# Patient Record
Sex: Female | Born: 1985 | Race: Black or African American | Hispanic: No | Smoking: Current every day smoker
Health system: Southern US, Community
[De-identification: ages and names within clinical notes are randomized; demographics above are authoritative.]

## PROBLEM LIST (undated history)

## (undated) ENCOUNTER — Inpatient Hospital Stay: Payer: Self-pay

## (undated) DIAGNOSIS — D649 Anemia, unspecified: Secondary | ICD-10-CM

## (undated) DIAGNOSIS — R87629 Unspecified abnormal cytological findings in specimens from vagina: Secondary | ICD-10-CM

## (undated) DIAGNOSIS — Z8759 Personal history of other complications of pregnancy, childbirth and the puerperium: Secondary | ICD-10-CM

## (undated) DIAGNOSIS — Z789 Other specified health status: Secondary | ICD-10-CM

## (undated) HISTORY — PX: WISDOM TOOTH EXTRACTION: SHX21

## (undated) HISTORY — DX: Unspecified abnormal cytological findings in specimens from vagina: R87.629

---
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
## 2007-05-20 ENCOUNTER — Encounter: Payer: Self-pay | Admitting: Maternal and Fetal Medicine

## 2007-05-20 ENCOUNTER — Inpatient Hospital Stay: Payer: Self-pay

## 2007-10-27 ENCOUNTER — Emergency Department: Payer: Self-pay | Admitting: Emergency Medicine

## 2008-04-16 ENCOUNTER — Emergency Department: Payer: Self-pay | Admitting: Emergency Medicine

## 2008-10-29 ENCOUNTER — Emergency Department: Payer: Self-pay | Admitting: Emergency Medicine

## 2008-12-28 ENCOUNTER — Emergency Department: Payer: Self-pay | Admitting: Emergency Medicine

## 2009-08-01 ENCOUNTER — Emergency Department: Payer: Self-pay | Admitting: Emergency Medicine

## 2010-04-06 ENCOUNTER — Emergency Department: Payer: Self-pay | Admitting: Emergency Medicine

## 2010-08-08 ENCOUNTER — Emergency Department: Payer: Self-pay | Admitting: Emergency Medicine

## 2011-02-06 ENCOUNTER — Emergency Department: Payer: Self-pay | Admitting: Emergency Medicine

## 2011-04-09 ENCOUNTER — Inpatient Hospital Stay: Payer: Self-pay

## 2011-04-13 LAB — PATHOLOGY REPORT

## 2011-08-08 ENCOUNTER — Emergency Department: Payer: Self-pay | Admitting: *Deleted

## 2013-02-02 ENCOUNTER — Emergency Department: Payer: Self-pay | Admitting: Internal Medicine

## 2013-03-24 ENCOUNTER — Emergency Department: Payer: Self-pay | Admitting: Emergency Medicine

## 2013-03-24 LAB — URINALYSIS, COMPLETE
Bilirubin,UR: NEGATIVE
Ph: 6 (ref 4.5–8.0)
RBC,UR: 13427 /HPF (ref 0–5)
WBC UR: 1263 /HPF (ref 0–5)

## 2013-03-25 LAB — URINE CULTURE

## 2013-04-14 ENCOUNTER — Emergency Department: Payer: Self-pay | Admitting: Emergency Medicine

## 2013-12-25 ENCOUNTER — Emergency Department: Payer: Self-pay | Admitting: Emergency Medicine

## 2013-12-25 LAB — URINALYSIS, COMPLETE
BACTERIA: NONE SEEN
BILIRUBIN, UR: NEGATIVE
Blood: NEGATIVE
Glucose,UR: NEGATIVE mg/dL (ref 0–75)
Ketone: NEGATIVE
Leukocyte Esterase: NEGATIVE
Nitrite: NEGATIVE
Ph: 5 (ref 4.5–8.0)
Protein: NEGATIVE
RBC,UR: 1 /HPF (ref 0–5)
Specific Gravity: 1.02 (ref 1.003–1.030)
Squamous Epithelial: 4

## 2013-12-25 LAB — GC/CHLAMYDIA PROBE AMP

## 2013-12-25 LAB — PREGNANCY, URINE: Pregnancy Test, Urine: NEGATIVE m[IU]/mL

## 2013-12-25 LAB — WET PREP, GENITAL

## 2014-03-02 ENCOUNTER — Emergency Department: Payer: Self-pay | Admitting: Emergency Medicine

## 2014-03-02 LAB — CBC WITH DIFFERENTIAL/PLATELET
BASOS ABS: 0.1 10*3/uL (ref 0.0–0.1)
BASOS PCT: 0.7 %
EOS ABS: 0 10*3/uL (ref 0.0–0.7)
Eosinophil %: 0.2 %
HCT: 43 % (ref 35.0–47.0)
HGB: 14.3 g/dL (ref 12.0–16.0)
Lymphocyte #: 1.5 10*3/uL (ref 1.0–3.6)
Lymphocyte %: 18.4 %
MCH: 29.9 pg (ref 26.0–34.0)
MCHC: 33.3 g/dL (ref 32.0–36.0)
MCV: 90 fL (ref 80–100)
Monocyte #: 0.5 x10 3/mm (ref 0.2–0.9)
Monocyte %: 6.2 %
Neutrophil #: 6.2 10*3/uL (ref 1.4–6.5)
Neutrophil %: 74.5 %
Platelet: 205 10*3/uL (ref 150–440)
RBC: 4.79 10*6/uL (ref 3.80–5.20)
RDW: 12.9 % (ref 11.5–14.5)
WBC: 8.3 10*3/uL (ref 3.6–11.0)

## 2014-03-02 LAB — BASIC METABOLIC PANEL
Anion Gap: 5 — ABNORMAL LOW (ref 7–16)
BUN: 11 mg/dL (ref 7–18)
CO2: 27 mmol/L (ref 21–32)
CREATININE: 0.95 mg/dL (ref 0.60–1.30)
Calcium, Total: 9.2 mg/dL (ref 8.5–10.1)
Chloride: 103 mmol/L (ref 98–107)
EGFR (African American): >60
EGFR (Non-African Amer.): >60
Glucose: 71 mg/dL (ref 65–99)
OSMOLALITY: 268 (ref 275–301)
Potassium: 3.6 mmol/L (ref 3.5–5.1)
SODIUM: 135 mmol/L — AB (ref 136–145)

## 2014-07-25 ENCOUNTER — Emergency Department: Payer: Self-pay | Admitting: Student

## 2014-07-25 LAB — CBC
HCT: 40 % (ref 35.0–47.0)
HGB: 13 g/dL (ref 12.0–16.0)
MCH: 30.3 pg (ref 26.0–34.0)
MCHC: 32.5 g/dL (ref 32.0–36.0)
MCV: 93 fL (ref 80–100)
PLATELETS: 173 10*3/uL (ref 150–440)
RBC: 4.3 10*6/uL (ref 3.80–5.20)
RDW: 13.1 % (ref 11.5–14.5)
WBC: 5.6 10*3/uL (ref 3.6–11.0)

## 2014-07-25 LAB — URINALYSIS, COMPLETE
Bacteria: NONE SEEN
Bilirubin,UR: NEGATIVE
Glucose,UR: NEGATIVE mg/dL (ref 0–75)
Ketone: NEGATIVE
Leukocyte Esterase: NEGATIVE
Nitrite: NEGATIVE
PH: 6 (ref 4.5–8.0)
Protein: NEGATIVE
RBC,UR: 43 /HPF (ref 0–5)
SPECIFIC GRAVITY: 1.026 (ref 1.003–1.030)
Squamous Epithelial: 9
WBC UR: 2 /HPF (ref 0–5)

## 2014-07-25 LAB — COMPREHENSIVE METABOLIC PANEL
ALBUMIN: 3.7 g/dL (ref 3.4–5.0)
ALT: 17 U/L
Alkaline Phosphatase: 46 U/L
Anion Gap: 8 (ref 7–16)
BILIRUBIN TOTAL: 0.3 mg/dL (ref 0.2–1.0)
BUN: 12 mg/dL (ref 7–18)
CHLORIDE: 106 mmol/L (ref 98–107)
Calcium, Total: 8.6 mg/dL (ref 8.5–10.1)
Co2: 26 mmol/L (ref 21–32)
Creatinine: 0.97 mg/dL (ref 0.60–1.30)
EGFR (African American): >60
EGFR (Non-African Amer.): >60
GLUCOSE: 72 mg/dL (ref 65–99)
Osmolality: 278 (ref 275–301)
POTASSIUM: 4.1 mmol/L (ref 3.5–5.1)
SGOT(AST): 25 U/L (ref 15–37)
Sodium: 140 mmol/L (ref 136–145)
TOTAL PROTEIN: 7.7 g/dL (ref 6.4–8.2)

## 2014-07-25 LAB — LIPASE, BLOOD: Lipase: 117 U/L (ref 73–393)

## 2014-07-25 LAB — GC/CHLAMYDIA PROBE AMP

## 2014-07-25 LAB — WET PREP, GENITAL

## 2014-08-09 ENCOUNTER — Emergency Department: Payer: Self-pay | Admitting: Emergency Medicine

## 2014-08-10 LAB — URINALYSIS, COMPLETE
BLOOD: NEGATIVE
Bilirubin,UR: NEGATIVE
Glucose,UR: NEGATIVE mg/dL (ref 0–75)
Ketone: NEGATIVE
Leukocyte Esterase: NEGATIVE
NITRITE: NEGATIVE
PROTEIN: NEGATIVE
Ph: 7 (ref 4.5–8.0)
RBC,UR: 5 /HPF (ref 0–5)
Specific Gravity: 1.01 (ref 1.003–1.030)
Squamous Epithelial: 2
WBC UR: 1 /HPF (ref 0–5)

## 2014-09-29 ENCOUNTER — Emergency Department: Payer: Self-pay | Admitting: Emergency Medicine

## 2014-09-29 LAB — COMPREHENSIVE METABOLIC PANEL
Albumin: 3.7 g/dL (ref 3.4–5.0)
Alkaline Phosphatase: 59 U/L
Anion Gap: 7 (ref 7–16)
BUN: 11 mg/dL (ref 7–18)
Bilirubin,Total: 0.5 mg/dL (ref 0.2–1.0)
CALCIUM: 8.6 mg/dL (ref 8.5–10.1)
Chloride: 106 mmol/L (ref 98–107)
Co2: 27 mmol/L (ref 21–32)
Creatinine: 0.75 mg/dL (ref 0.60–1.30)
EGFR (Non-African Amer.): >60
Glucose: 81 mg/dL (ref 65–99)
Osmolality: 278 (ref 275–301)
Potassium: 3.9 mmol/L (ref 3.5–5.1)
SGOT(AST): 22 U/L (ref 15–37)
SGPT (ALT): 19 U/L
SODIUM: 140 mmol/L (ref 136–145)
Total Protein: 7.7 g/dL (ref 6.4–8.2)

## 2014-09-29 LAB — CBC WITH DIFFERENTIAL/PLATELET
Basophil #: 0 10*3/uL (ref 0.0–0.1)
Basophil %: 0.5 %
Eosinophil #: 0 10*3/uL (ref 0.0–0.7)
Eosinophil %: 0.6 %
HCT: 40.2 % (ref 35.0–47.0)
HGB: 13.2 g/dL (ref 12.0–16.0)
LYMPHS ABS: 1.8 10*3/uL (ref 1.0–3.6)
Lymphocyte %: 34.8 %
MCH: 29.8 pg (ref 26.0–34.0)
MCHC: 32.8 g/dL (ref 32.0–36.0)
MCV: 91 fL (ref 80–100)
Monocyte #: 0.3 x10 3/mm (ref 0.2–0.9)
Monocyte %: 6.6 %
NEUTROS ABS: 3 10*3/uL (ref 1.4–6.5)
Neutrophil %: 57.5 %
PLATELETS: 183 10*3/uL (ref 150–440)
RBC: 4.42 10*6/uL (ref 3.80–5.20)
RDW: 12.6 % (ref 11.5–14.5)
WBC: 5.2 10*3/uL (ref 3.6–11.0)

## 2014-09-29 LAB — LIPASE, BLOOD: LIPASE: 100 U/L (ref 73–393)

## 2014-09-29 LAB — URINALYSIS, COMPLETE
BILIRUBIN, UR: NEGATIVE
Blood: NEGATIVE
GLUCOSE, UR: NEGATIVE mg/dL (ref 0–75)
Ketone: NEGATIVE
Leukocyte Esterase: NEGATIVE
Nitrite: NEGATIVE
PH: 6 (ref 4.5–8.0)
PROTEIN: NEGATIVE
RBC,UR: <1 /HPF (ref 0–5)
Specific Gravity: 1.013 (ref 1.003–1.030)
WBC UR: 2 /HPF (ref 0–5)

## 2014-09-30 LAB — WET PREP, GENITAL

## 2014-09-30 LAB — GC/CHLAMYDIA PROBE AMP

## 2015-02-28 ENCOUNTER — Emergency Department: Payer: Self-pay | Admitting: Emergency Medicine

## 2015-04-29 ENCOUNTER — Emergency Department
Admission: EM | Admit: 2015-04-29 | Discharge: 2015-04-29 | Disposition: A | Discharge status: Home / Self Care | Payer: Medicaid Other | Attending: Emergency Medicine | Admitting: Emergency Medicine

## 2015-04-29 DIAGNOSIS — N3090 Cystitis, unspecified without hematuria: Secondary | ICD-10-CM | POA: Insufficient documentation

## 2015-04-29 DIAGNOSIS — N72 Inflammatory disease of cervix uteri: Secondary | ICD-10-CM | POA: Diagnosis not present

## 2015-04-29 DIAGNOSIS — R350 Frequency of micturition: Secondary | ICD-10-CM | POA: Diagnosis present

## 2015-04-29 DIAGNOSIS — Z720 Tobacco use: Secondary | ICD-10-CM | POA: Diagnosis not present

## 2015-04-29 DIAGNOSIS — Z3202 Encounter for pregnancy test, result negative: Secondary | ICD-10-CM | POA: Diagnosis not present

## 2015-04-29 LAB — URINALYSIS COMPLETE WITH MICROSCOPIC (ARMC ONLY)
BILIRUBIN URINE: NEGATIVE
Glucose, UA: NEGATIVE mg/dL
KETONES UR: NEGATIVE mg/dL
Nitrite: NEGATIVE
Protein, ur: NEGATIVE mg/dL
Specific Gravity, Urine: 1.013 (ref 1.005–1.030)
pH: 7 (ref 5.0–8.0)

## 2015-04-29 LAB — COMPREHENSIVE METABOLIC PANEL
ALBUMIN: 4.3 g/dL (ref 3.5–5.0)
ALT: 12 U/L — ABNORMAL LOW (ref 14–54)
ANION GAP: 5 (ref 5–15)
AST: 21 U/L (ref 15–41)
Alkaline Phosphatase: 47 U/L (ref 38–126)
BILIRUBIN TOTAL: 0.4 mg/dL (ref 0.3–1.2)
BUN: 10 mg/dL (ref 6–20)
CALCIUM: 8.7 mg/dL — AB (ref 8.9–10.3)
CHLORIDE: 106 mmol/L (ref 101–111)
CO2: 26 mmol/L (ref 22–32)
CREATININE: 0.82 mg/dL (ref 0.44–1.00)
GFR calc Af Amer: >60 mL/min (ref >60)
Glucose, Bld: 83 mg/dL (ref 65–99)
Potassium: 3.6 mmol/L (ref 3.5–5.1)
Sodium: 137 mmol/L (ref 135–145)
Total Protein: 7.5 g/dL (ref 6.5–8.1)

## 2015-04-29 LAB — CBC WITH DIFFERENTIAL/PLATELET
Basophils Absolute: 0 10*3/uL (ref 0–0.1)
Basophils Relative: 0 %
EOS ABS: 0 10*3/uL (ref 0–0.7)
Eosinophils Relative: 0 %
HEMATOCRIT: 41.3 % (ref 35.0–47.0)
HEMOGLOBIN: 13.5 g/dL (ref 12.0–16.0)
Lymphocytes Relative: 49 %
Lymphs Abs: 2.1 10*3/uL (ref 1.0–3.6)
MCH: 30.1 pg (ref 26.0–34.0)
MCHC: 32.7 g/dL (ref 32.0–36.0)
MCV: 92.1 fL (ref 80.0–100.0)
MONO ABS: 0.4 10*3/uL (ref 0.2–0.9)
Monocytes Relative: 8 %
Neutro Abs: 1.9 10*3/uL (ref 1.4–6.5)
Neutrophils Relative %: 43 %
PLATELETS: 156 10*3/uL (ref 150–440)
RBC: 4.49 MIL/uL (ref 3.80–5.20)
RDW: 13.1 % (ref 11.5–14.5)
WBC: 4.5 10*3/uL (ref 3.6–11.0)

## 2015-04-29 LAB — WET PREP, GENITAL: Yeast Wet Prep HPF POC: NONE SEEN

## 2015-04-29 LAB — LIPASE, BLOOD: Lipase: 26 U/L (ref 22–51)

## 2015-04-29 LAB — POCT PREGNANCY, URINE: Preg Test, Ur: NEGATIVE

## 2015-04-29 MED ORDER — OXYCODONE-ACETAMINOPHEN 5-325 MG PO TABS
ORAL_TABLET | ORAL | Status: AC
  Filled 2015-04-29: qty 2

## 2015-04-29 MED ORDER — TRAMADOL HCL 50 MG PO TABS: 50.0000 mg | ORAL_TABLET | Freq: Four times a day (QID) | ORAL | Status: AC | PRN

## 2015-04-29 MED ORDER — AZITHROMYCIN 250 MG PO TABS
1000.0000 mg | ORAL_TABLET | Freq: Once | ORAL | Status: AC
  Administered 2015-04-29: 1000 mg via ORAL

## 2015-04-29 MED ORDER — CEFTRIAXONE SODIUM 250 MG IJ SOLR
INTRAMUSCULAR | Status: AC
  Administered 2015-04-29: 250 mg via INTRAMUSCULAR
  Filled 2015-04-29: qty 250

## 2015-04-29 MED ORDER — AZITHROMYCIN 250 MG PO TABS
ORAL_TABLET | ORAL | Status: AC
  Administered 2015-04-29: 1000 mg via ORAL
  Filled 2015-04-29: qty 4

## 2015-04-29 MED ORDER — METRONIDAZOLE 500 MG PO TABS: 500.0000 mg | ORAL_TABLET | Freq: Two times a day (BID) | ORAL | Status: DC

## 2015-04-29 MED ORDER — CEFTRIAXONE SODIUM 250 MG IJ SOLR
INTRAMUSCULAR | Status: AC
  Filled 2015-04-29: qty 250

## 2015-04-29 MED ORDER — DOXYCYCLINE HYCLATE 100 MG PO CAPS: 100.0000 mg | ORAL_CAPSULE | Freq: Two times a day (BID) | ORAL | Status: DC

## 2015-04-29 MED ORDER — CEFTRIAXONE SODIUM 250 MG IJ SOLR
250.0000 mg | INTRAMUSCULAR | Status: DC
  Administered 2015-04-29: 250 mg via INTRAMUSCULAR

## 2015-04-29 MED ORDER — OXYCODONE-ACETAMINOPHEN 5-325 MG PO TABS
2.0000 | ORAL_TABLET | Freq: Once | ORAL | Status: AC
  Administered 2015-04-29: 2 via ORAL

## 2015-04-29 NOTE — ED Provider Notes (Signed)
[facility] Provider Note     Time seen: ----------------------------------------- 9:33 PM on 04/29/2015 -----------------------------------------    I have reviewed the triage vital signs and the nursing notes.   HISTORY  Chief Complaint Urinary Frequency    HPI Cynthia [facility] is a 29 y.o. female [facility] for urinary frequency with ink tinged discharge from wiping for the last 2 days. She also complains of abdominal and pelvic cramping. Doesn't think she is pregnant. Last time this happened to her she had a UTI. She does describe vaginal discharge     History reviewed. No pertinent past medical history.  There are no active problems to display for this patient.   History reviewed. No pertinent past surgical history.  No current outpatient prescriptions on file.  Allergies Review of patient's allergies indicates no known allergies.  No family history on file.  Social History History  Substance Use Topics  . Smoking status: Current Every Day Smoker  . Smokeless tobacco: Never Used  . Alcohol Use: Yes    Review of Systems Constitutional: Negative for fever. Eyes: Negative for visual changes. ENT: Negative for sore throat. Cardiovascular: Negative for chest pain. Respiratory: Negative for shortness of breath. Gastrointestinal: Negative for abdominal pain, vomiting and diarrhea. Genitourinary: Positive for polyuria and vaginal discharge  Musculoskeletal: Negative for back pain. Skin: Negative for rash. Neurological: Negative for headaches, focal weakness or numbness.  10-point ROS otherwise negative.  ____________________________________________   PHYSICAL EXAM:  VITAL SIGNS: ED Triage Vitals  Enc Vitals Group     BP 04/29/15 2042 114/85 mmHg     Pulse Rate 04/29/15 2042 62     Resp 04/29/15 2042 16     Temp 04/29/15 2042 98.7 F (37.1 C)     Temp Source 04/29/15 2042 Oral     SpO2 04/29/15 2042 100 %      Weight 04/29/15 2042 104 lb (47.174 kg)     Height 04/29/15 2042 5\' 5"  (1.651 m)     Head Cir --      Peak Flow --      Pain Score 04/29/15 2043 4     Pain Loc --      Pain Edu? --      Excl. in GC? --     Constitutional: Alert and oriented. Well appearing and in no distress. Eyes: Conjunctivae are normal. PERRL. Normal extraocular movements. ENT   Head: Normocephalic and atraumatic.   Nose: No congestion/rhinnorhea.   Mouth/Throat: Mucous membranes are moist.   Neck: No stridor. Hematological/Lymphatic/Immunilogical: No cervical lymphadenopathy. Cardiovascular: Normal rate, regular rhythm. Normal and symmetric distal pulses are present in all extremities. No murmurs, rubs, or gallops. Respiratory: Normal respiratory effort without tachypnea nor retractions. Breath sounds are clear and equal bilaterally. No wheezes/rales/rhonchi. Gastrointestinal: Soft and nontender. No distention. No abdominal bruits. There is no CVA tenderness. Genitourinary: Cervix is inflamed, there is cervical motion tenderness and vaginal discharge present Musculoskeletal: Nontender with normal range of motion in all extremities. No joint effusions.  No lower extremity tenderness nor edema. Neurologic:  Normal speech and language. No gross focal neurologic deficits are appreciated. Speech is normal. No gait instability. Skin:  Skin is warm, dry and intact. No rash noted. Psychiatric: Mood and affect are normal. Speech and behavior are normal. Patient exhibits appropriate insight and judgment.  ____________________________________________    LABS (pertinent positives/negatives)  Labs Reviewed  COMPREHENSIVE METABOLIC PANEL - Abnormal; Notable for the following:    Calcium 8.7 (*)  ALT 12 (*)    All other components within normal limits  URINALYSIS COMPLETEWITH MICROSCOPIC ([facility])  - Abnormal; Notable for the following:    Color, Urine YELLOW (*)    APPearance CLOUDY (*)    Hgb urine  dipstick 1+ (*)    Leukocytes, UA 3+ (*)    Bacteria, UA RARE (*)    Squamous Epithelial / LPF TOO NUMEROUS TO COUNT (*)    All other components within normal limits  CHLAMYDIA/NGC RT PCR ([facility])   WET PREP, GENITAL  CBC WITH DIFFERENTIAL/PLATELET  LIPASE, BLOOD  POC URINE PREG, ED  POCT PREGNANCY, URINE    ____________________________________________  ED COURSE:  Pertinent labs & imaging results that were available during my care of the patient were reviewed by me and considered in my medical decision making (see chart for details).  Patient will need pelvic examination and reevaluation ____________________________________________   RADIOLOGY  None  ____________________________________________    FINAL ASSESSMENT AND PLAN  Cervicitis and cystitis  Plan: I think the urine results are likely coming from pelvic origin. She is given Rocephin and Zithromax here, she will be discharged with doxycycline and Flagyl. Encouraged to have close follow-up with her primary care doctor in 2-3 days if not improving.    Emily FilbertWilliams, Jonathan E, MD    Emily FilbertJonathan E Williams, MD 04/29/15 2226

## 2015-04-29 NOTE — ED Notes (Signed)
Pt arrived to ED with c/o urinary frequency with pink tinged discharge when wiping x 2 days. Pt also c/o abdominal cramping.

## 2015-04-29 NOTE — Discharge Instructions (Signed)
Cervicitis °Cervicitis is a soreness and swelling (inflammation) of the cervix. Your cervix is located at the bottom of your uterus. It opens up to the vagina. °CAUSES  °· Sexually transmitted infections (STIs).   °· Allergic reaction.   °· Medicines or birth control devices that are put in the vagina.   °· Injury to the cervix.   °· Bacterial infections.   °RISK FACTORS °You are at greater risk if you: °· Have unprotected sexual intercourse. °· Have sexual intercourse with many partners. °· Began sexual intercourse at an early age. °· Have a history of STIs. °SYMPTOMS  °There may be no symptoms. If symptoms occur, they may include:  °· Gray, white, yellow, or bad-smelling vaginal discharge.   °· Pain or itching of the area outside the vagina.   °· Painful sexual intercourse.   °· Lower abdominal or lower back pain, especially during intercourse.   °· Frequent urination.   °· Abnormal vaginal bleeding between periods, after sexual intercourse, or after menopause.   °· Pressure or a heavy feeling in the pelvis.   °DIAGNOSIS  °Diagnosis is made after a pelvic exam. Other tests may include:  °· Examination of any discharge under a microscope (wet prep).   °· A Pap test.   °TREATMENT  °Treatment will depend on the cause of cervicitis. If it is caused by an STI, both you and your partner will need to be treated. Antibiotic medicines will be given.  °HOME CARE INSTRUCTIONS  °· Do not have sexual intercourse until your health care provider says it is okay.   °· Do not have sexual intercourse until your partner has been treated, if your cervicitis is caused by an STI.   °· Take your antibiotics as directed. Finish them even if you start to feel better.   °SEEK MEDICAL CARE IF: °· Your symptoms come back.   °· You have a fever.   °MAKE SURE YOU:  °· Understand these instructions. °· Will watch your condition. °· Will get help right away if you are not doing well or get worse. °Document Released: 11/27/2005 Document Revised:  12/02/2013 Document Reviewed: 05/21/2013 °[employer]® Patient Information ©2015 [employer]. This information is not intended to replace advice given to you by your health care provider. Make sure you discuss any questions you have with your health care provider. ° °

## 2015-04-29 NOTE — ED Notes (Signed)
Pt medicated as ordered.

## 2015-04-30 LAB — CHLAMYDIA/NGC RT PCR (ARMC ONLY)
Chlamydia Tr: NOT DETECTED
N GONORRHOEAE: NOT DETECTED

## 2015-07-05 DIAGNOSIS — Z79899 Other long term (current) drug therapy: Secondary | ICD-10-CM | POA: Diagnosis not present

## 2015-07-05 DIAGNOSIS — A5909 Other urogenital trichomoniasis: Secondary | ICD-10-CM | POA: Insufficient documentation

## 2015-07-05 DIAGNOSIS — Z3A00 Weeks of gestation of pregnancy not specified: Secondary | ICD-10-CM | POA: Diagnosis not present

## 2015-07-05 DIAGNOSIS — O98611 Protozoal diseases complicating pregnancy, first trimester: Secondary | ICD-10-CM | POA: Diagnosis not present

## 2015-07-05 DIAGNOSIS — O99331 Smoking (tobacco) complicating pregnancy, first trimester: Secondary | ICD-10-CM | POA: Diagnosis not present

## 2015-07-05 DIAGNOSIS — O9989 Other specified diseases and conditions complicating pregnancy, childbirth and the puerperium: Secondary | ICD-10-CM | POA: Diagnosis present

## 2015-07-05 DIAGNOSIS — F17210 Nicotine dependence, cigarettes, uncomplicated: Secondary | ICD-10-CM | POA: Insufficient documentation

## 2015-07-05 DIAGNOSIS — Z792 Long term (current) use of antibiotics: Secondary | ICD-10-CM | POA: Diagnosis not present

## 2015-07-05 DIAGNOSIS — O23591 Infection of other part of genital tract in pregnancy, first trimester: Secondary | ICD-10-CM | POA: Insufficient documentation

## 2015-07-05 LAB — COMPREHENSIVE METABOLIC PANEL
ALBUMIN: 4.3 g/dL (ref 3.5–5.0)
ALT: 18 U/L (ref 14–54)
AST: 27 U/L (ref 15–41)
Alkaline Phosphatase: 49 U/L (ref 38–126)
Anion gap: 6 (ref 5–15)
BILIRUBIN TOTAL: 0.8 mg/dL (ref 0.3–1.2)
BUN: 11 mg/dL (ref 6–20)
CHLORIDE: 104 mmol/L (ref 101–111)
CO2: 26 mmol/L (ref 22–32)
CREATININE: 0.87 mg/dL (ref 0.44–1.00)
Calcium: 9.2 mg/dL (ref 8.9–10.3)
GFR calc Af Amer: >60 mL/min (ref >60)
GFR calc non Af Amer: >60 mL/min (ref >60)
Glucose, Bld: 60 mg/dL — ABNORMAL LOW (ref 65–99)
Potassium: 3.2 mmol/L — ABNORMAL LOW (ref 3.5–5.1)
Sodium: 136 mmol/L (ref 135–145)
Total Protein: 8 g/dL (ref 6.5–8.1)

## 2015-07-05 LAB — POCT PREGNANCY, URINE: Preg Test, Ur: POSITIVE — AB

## 2015-07-05 LAB — URINALYSIS COMPLETE WITH MICROSCOPIC (ARMC ONLY)
BILIRUBIN URINE: NEGATIVE
GLUCOSE, UA: NEGATIVE mg/dL
Hgb urine dipstick: NEGATIVE
Ketones, ur: NEGATIVE mg/dL
Nitrite: NEGATIVE
Protein, ur: NEGATIVE mg/dL
SPECIFIC GRAVITY, URINE: 1.016 (ref 1.005–1.030)
pH: 6 (ref 5.0–8.0)

## 2015-07-05 LAB — CBC
HCT: 39.7 % (ref 35.0–47.0)
Hemoglobin: 13 g/dL (ref 12.0–16.0)
MCH: 29.5 pg (ref 26.0–34.0)
MCHC: 32.8 g/dL (ref 32.0–36.0)
MCV: 89.9 fL (ref 80.0–100.0)
PLATELETS: 157 10*3/uL (ref 150–440)
RBC: 4.41 MIL/uL (ref 3.80–5.20)
RDW: 12.8 % (ref 11.5–14.5)
WBC: 5.5 10*3/uL (ref 3.6–11.0)

## 2015-07-05 LAB — LIPASE, BLOOD: Lipase: 24 U/L (ref 22–51)

## 2015-07-05 NOTE — ED Notes (Signed)
Patient ambulatory to triage with steady gait, without difficulty or distress notedp; pt reports sharp abd paintoday starting low and into mid abd accomp by nausea

## 2015-07-06 ENCOUNTER — Encounter: Payer: Self-pay | Admitting: Emergency Medicine

## 2015-07-06 ENCOUNTER — Emergency Department
Admission: EM | Admit: 2015-07-06 | Discharge: 2015-07-06 | Disposition: A | Discharge status: Home / Self Care | Payer: Medicaid Other | Attending: Emergency Medicine | Admitting: Emergency Medicine

## 2015-07-06 ENCOUNTER — Emergency Department: Payer: Medicaid Other

## 2015-07-06 DIAGNOSIS — B9689 Other specified bacterial agents as the cause of diseases classified elsewhere: Secondary | ICD-10-CM

## 2015-07-06 DIAGNOSIS — A5909 Other urogenital trichomoniasis: Secondary | ICD-10-CM

## 2015-07-06 DIAGNOSIS — N760 Acute vaginitis: Secondary | ICD-10-CM

## 2015-07-06 DIAGNOSIS — Z3491 Encounter for supervision of normal pregnancy, unspecified, first trimester: Secondary | ICD-10-CM

## 2015-07-06 LAB — WET PREP, GENITAL: Yeast Wet Prep HPF POC: NONE SEEN

## 2015-07-06 LAB — HCG, QUANTITATIVE, PREGNANCY: hCG, Beta Chain, Quant, S: 808 m[IU]/mL — ABNORMAL HIGH (ref <5)

## 2015-07-06 LAB — TYPE AND SCREEN
ABO/RH(D): O POS
ANTIBODY SCREEN: NEGATIVE

## 2015-07-06 LAB — ABO/RH: ABO/RH(D): O POS

## 2015-07-06 LAB — CHLAMYDIA/NGC RT PCR (ARMC ONLY)
Chlamydia Tr: NOT DETECTED
N GONORRHOEAE: NOT DETECTED

## 2015-07-06 MED ORDER — METRONIDAZOLE 500 MG PO TABS: 500.0000 mg | ORAL_TABLET | Freq: Two times a day (BID) | ORAL | Status: DC

## 2015-07-06 MED ORDER — METRONIDAZOLE 500 MG PO TABS
500.0000 mg | ORAL_TABLET | Freq: Once | ORAL | Status: AC
  Administered 2015-07-06: 500 mg via ORAL
  Filled 2015-07-06: qty 1

## 2015-07-06 MED ORDER — ACETAMINOPHEN 325 MG PO TABS
ORAL_TABLET | ORAL | Status: AC
  Administered 2015-07-06: 325 mg
  Filled 2015-07-06: qty 2

## 2015-07-06 MED ORDER — IBUPROFEN 400 MG PO TABS
400.0000 mg | ORAL_TABLET | Freq: Once | ORAL | Status: DC
  Filled 2015-07-06: qty 1

## 2015-07-06 NOTE — ED Provider Notes (Signed)
[facility] Emergency Department Provider Note  ____________________________________________  Time seen: 12:30 AM  I have reviewed the triage vital signs and the nursing notes.   HISTORY  Chief Complaint Abdominal Pain     HPI Cyn[facility] is a 29 y.o. female presents withsharp bilateral lower pelvic pain times one day. Patient is a G3 P1 with one previous miscarriage at 5 months. Patient admits to taking 2 prior pregnancy tests at home one of which was negative the most recent 2 days ago was positive.   Current Outpatient Rx  Name  Route  Sig  Dispense  Refill  . doxycycline (VIBRAMYCIN) 100 MG capsule   Oral   Take 1 capsule (100 mg total) by mouth 2 (two) times daily.   14 capsule   0   . metroNIDAZOLE (FLAGYL) 500 MG tablet   Oral   Take 1 tablet (500 mg total) by mouth 2 (two) times daily.   14 tablet   0   . traMADol (ULTRAM) 50 MG tablet   Oral   Take 1 tablet (50 mg total) by mouth every 6 (six) hours as needed.   20 tablet   0     Allergies No known drug allergies  No family history on file.  Social History History  Substance Use Topics  . Smoking status: Current Every Day Smoker  . Smokeless tobacco: Never Used  . Alcohol Use: Yes    Review of Systems  Constitutional: Negative for fever. Eyes: Negative for visual changes. ENT: Negative for sore throat. Cardiovascular: Negative for chest pain. Respiratory: Negative for shortness of breath. Gastrointestinal: Negative for abdominal pain, vomiting and diarrhea. Positive pelvic pain Genitourinary: Negative for dysuria. Musculoskeletal: Negative for back pain. Skin: Negative for rash. Neurological: Negative for headaches, focal weakness or numbness.   10-point ROS otherwise negative.  ____________________________________________   PHYSICAL EXAM:  VITAL SIGNS: ED Triage Vitals  Enc Vitals Group     BP 07/05/15 2115 110/58 mmHg     Pulse Rate 07/05/15 2115 68      Resp 07/05/15 2115 18     Temp 07/05/15 2115 98.4 F (36.9 C)     Temp Source 07/05/15 2115 Oral     SpO2 07/05/15 2115 100 %     Weight 07/05/15 2115 108 lb (48.988 kg)     Height 07/05/15 2115  (1.651 m)     Head Cir --      Peak Flow --      Pain Score 07/05/15 2116 5     Pain Loc --      Pain Edu? --      Excl. in GC? --      Constitutional: Alert and oriented. Well appearing and in no distress. Eyes: Conjunctivae are normal. PERRL. Normal extraocular movements. ENT   Head: Normocephalic and atraumatic.   Nose: No congestion/rhinnorhea.   Mouth/Throat: Mucous membranes are moist.   Neck: No stridor. Cardiovascular: Normal rate, regular rhythm. Normal and symmetric distal pulses are present in all extremities. No murmurs, rubs, or gallops. Respiratory: Normal respiratory effort without tachypnea nor retractions. Breath sounds are clear and equal bilaterally. No wheezes/rales/rhonchi. Gastrointestinal: Soft and nontender. No distention. There is no CVA tenderness. Genitourinary: deferred Musculoskeletal: Nontender with normal range of motion in all extremities. No joint effusions.  No lower extremity tenderness nor edema. Neurologic:  Normal speech and language. No gross focal neurologic deficits are appreciated. Speech is normal.  Skin:  Skin is warm, dry and  intact. No rash noted. Psychiatric: Mood and affect are normal. Speech and behavior are normal. Patient exhibits appropriate insight and judgment.  ____________________________________________    LABS (pertinent positives/negatives)  Labs Reviewed  COMPREHENSIVE METABOLIC PANEL - Abnormal; Notable for the following:    Potassium 3.2 (*)    Glucose, Bld 60 (*)    All other components within normal limits  URINALYSIS COMPLETEWITH MICROSCOPIC ([facility] ONLY) - Abnormal; Notable for the following:    Color, Urine YELLOW (*)    APPearance HAZY (*)    Leukocytes, UA 2+ (*)    Bacteria, UA FEW (*)     Squamous Epithelial / LPF 6-30 (*)    All other components within normal limits  POCT PREGNANCY, URINE - Abnormal; Notable for the following:    Preg Test, Ur POSITIVE (*)    All other components within normal limits  LIPASE, BLOOD  CBC  HCG, QUANTITATIVE, PREGNANCY  POC URINE PREG, ED   Ultrasound of pelvis revealed       US Ob Transvaginal (Final result) Result time: 07/06/15 03:04:00   Final result by Rad Results In Interface (07/06/15 03:04:00)   Narrative:   CLINICAL DATA: Pelvic pain and vaginal bleeding.  EXAM: OBSTETRIC <14 WK [place] AND TRANSVAGINAL OB US  TECHNIQUE: Both transabdominal and transvaginal ultrasound examinations were performed for complete evaluation of the gestation as well as the maternal uterus, adnexal regions, and pelvic cul-de-sac. Transvaginal technique was performed to assess early pregnancy.  COMPARISON: None.  FINDINGS: Intrauterine gestational sac: No  Yolk sac: No  Embryo: No  Cardiac Activity: None  Maternal uterus/adnexae: Normal  IMPRESSION: No visible gestational sac. Normal uterus and ovaries. Considerations include spontaneous complete abortion, intrauterine pregnancy which is too small to visualize, ectopic gestation. Correlation with quantitative HCG and close follow-up recommended.   Electronically Signed By: Ellery Plunk M.D. On: 07/06/2015 03:04       INITIAL IMPRESSION / ASSESSMENT AND PLAN / ED COURSE  Pertinent labs & imaging results that were available during my care of the patient were reviewed by me and considered in my medical decision making (see chart for details).  History of physical exam consistent with very early first trimester pregnancy. Ultrasound revealed no gestational sac this may be secondary to early gestation. In addition patient positive for Trichomonas and BV  ____________________________________________   FINAL CLINICAL IMPRESSION(S) / ED DIAGNOSES  Final diagnoses:   First trimester pregnancy  Trichomonal cervicitis  BV (bacterial vaginosis)      Darci Current, MD 07/06/15 [phone]

## 2015-07-06 NOTE — ED Notes (Addendum)
MD at bedside discussing Urine Preg results.

## 2015-07-06 NOTE — Discharge Instructions (Signed)
Bacterial Vaginosis °Bacterial vaginosis is a vaginal infection that occurs when the normal balance of bacteria in the vagina is disrupted. It results from an overgrowth of certain bacteria. This is the most common vaginal infection in women of childbearing age. Treatment is important to prevent complications, especially in pregnant women, as it can cause a premature delivery. °CAUSES  °Bacterial vaginosis is caused by an increase in harmful bacteria that are normally present in smaller amounts in the vagina. Several different kinds of bacteria can cause bacterial vaginosis. However, the reason that the condition develops is not fully understood. °RISK FACTORS °Certain activities or behaviors can put you at an increased risk of developing bacterial vaginosis, including: °· Having a new sex partner or multiple sex partners. °· Douching. °· Using an intrauterine device (IUD) for contraception. °Women do not get bacterial vaginosis from toilet seats, bedding, swimming pools, or contact with objects around them. °SIGNS AND SYMPTOMS  °Some women with bacterial vaginosis have no signs or symptoms. Common symptoms include: °· Grey vaginal discharge. °· A fishlike odor with discharge, especially after sexual intercourse. °· Itching or burning of the vagina and vulva. °· Burning or pain with urination. °DIAGNOSIS  °Your health care provider will take a medical history and examine the vagina for signs of bacterial vaginosis. A sample of vaginal fluid may be taken. Your health care provider will look at this sample under a microscope to check for bacteria and abnormal cells. A vaginal pH test may also be done.  °TREATMENT  °Bacterial vaginosis may be treated with antibiotic medicines. These may be given in the form of a pill or a vaginal cream. A second round of antibiotics may be prescribed if the condition comes back after treatment.  °HOME CARE INSTRUCTIONS  °· Only take over-the-counter or prescription medicines as  directed by your health care provider. °· If antibiotic medicine was prescribed, take it as directed. Make sure you finish it even if you start to feel better. °· Do not have sex until treatment is completed. °· Tell all sexual partners that you have a vaginal infection. They should see their health care provider and be treated if they have problems, such as a mild rash or itching. °· Practice safe sex by using condoms and only having one sex partner. °SEEK MEDICAL CARE IF:  °· Your symptoms are not improving after 3 days of treatment. °· You have increased discharge or pain. °· You have a fever. °MAKE SURE YOU:  °· Understand these instructions. °· Will watch your condition. °· Will get help right away if you are not doing well or get worse. °FOR MORE INFORMATION  °Centers for Disease Control and Prevention, Division of STD Prevention: www.cdc.gov/std °American Sexual Health Association (ASHA): www.ashastd.org  °Document Released: 11/27/2005 Document Revised: 09/17/2013 Document Reviewed: 07/09/2013 °[employer]® Patient Information ©2015 [employer]. This information is not intended to replace advice given to you by your health care provider. Make sure you discuss any questions you have with your health care provider. ° °First Trimester of Pregnancy °The first trimester of pregnancy is from week 1 until the end of week 12 (months 1 through 3). A week after a sperm fertilizes an egg, the egg will implant on the wall of the uterus. This embryo will begin to develop into a baby. Genes from you and your partner are forming the baby. The female genes determine whether the baby is a boy or a girl. At 6-8 weeks, the eyes and face are formed, and the   heartbeat can be seen on ultrasound. At the end of 12 weeks, all the baby's organs are formed.  °Now that you are pregnant, you will want to do everything you can to have a healthy baby. Two of the most important things are to get good prenatal care and to follow your health  care provider's instructions. Prenatal care is all the medical care you receive before the baby's birth. This care will help prevent, find, and treat any problems during the pregnancy and childbirth. °BODY CHANGES °Your body goes through many changes during pregnancy. The changes vary from woman to woman.  °· You may gain or lose a couple of pounds at first. °· You may feel sick to your stomach (nauseous) and throw up (vomit). If the vomiting is uncontrollable, call your health care provider. °· You may tire easily. °· You may develop headaches that can be relieved by medicines approved by your health care provider. °· You may urinate more often. Painful urination may mean you have a bladder infection. °· You may develop heartburn as a result of your pregnancy. °· You may develop constipation because certain hormones are causing the muscles that push waste through your intestines to slow down. °· You may develop hemorrhoids or swollen, bulging veins (varicose veins). °· Your breasts may begin to grow larger and become tender. Your nipples may stick out more, and the tissue that surrounds them (areola) may become darker. °· Your gums may bleed and may be sensitive to brushing and flossing. °· Dark spots or blotches (chloasma, mask of pregnancy) may develop on your face. This will likely fade after the baby is born. °· Your menstrual periods will stop. °· You may have a loss of appetite. °· You may develop cravings for certain kinds of food. °· You may have changes in your emotions from day to day, such as being excited to be pregnant or being concerned that something may go wrong with the pregnancy and baby. °· You may have more vivid and strange dreams. °· You may have changes in your hair. These can include thickening of your hair, rapid growth, and changes in texture. Some women also have hair loss during or after pregnancy, or hair that feels dry or thin. Your hair will most likely return to normal after your  baby is born. °WHAT TO EXPECT AT YOUR PRENATAL VISITS °During a routine prenatal visit: °· You will be weighed to make sure you and the baby are growing normally. °· Your blood pressure will be taken. °· Your abdomen will be measured to track your baby's growth. °· The fetal heartbeat will be listened to starting around week 10 or 12 of your pregnancy. °· Test results from any previous visits will be discussed. °Your health care provider may ask you: °· How you are feeling. °· If you are feeling the baby move. °· If you have had any abnormal symptoms, such as leaking fluid, bleeding, severe headaches, or abdominal cramping. °· If you have any questions. °Other tests that may be performed during your first trimester include: °· Blood tests to find your blood type and to check for the presence of any previous infections. They will also be used to check for low iron levels (anemia) and Rh antibodies. Later in the pregnancy, blood tests for diabetes will be done along with other tests if problems develop. °· Urine tests to check for infections, diabetes, or protein in the urine. °· An ultrasound to confirm the proper growth and development   of the baby.  An amniocentesis to check for possible genetic problems.  Fetal screens for spina bifida and Down syndrome.  You may need other tests to make sure you and the baby are doing well. HOME CARE INSTRUCTIONS  Medicines  Follow your health care provider's instructions regarding medicine use. Specific medicines may be either safe or unsafe to take during pregnancy.  Take your prenatal vitamins as directed.  If you develop constipation, try taking a stool softener if your health care provider approves. Diet  Eat regular, well-balanced meals. Choose a variety of foods, such as meat or vegetable-based protein, fish, milk and low-fat dairy products, vegetables, fruits, and whole grain breads and cereals. Your health care provider will help you determine the amount  of weight gain that is right for you.  Avoid raw meat and uncooked cheese. These carry germs that can cause birth defects in the baby.  Eating four or five small meals rather than three large meals a day may help relieve nausea and vomiting. If you start to feel nauseous, eating a few soda crackers can be helpful. Drinking liquids between meals instead of during meals also seems to help nausea and vomiting.  If you develop constipation, eat more high-fiber foods, such as fresh vegetables or fruit and whole grains. Drink enough fluids to keep your urine clear or pale yellow. Activity and Exercise  Exercise only as directed by your health care provider. Exercising will help you:  Control your weight.  Stay in shape.  Be prepared for labor and delivery.  Experiencing pain or cramping in the lower abdomen or low back is a good sign that you should stop exercising. Check with your health care provider before continuing normal exercises.  Try to avoid standing for long periods of time. Move your legs often if you must stand in one place for a long time.  Avoid heavy lifting.  Wear low-heeled shoes, and practice good posture.  You may continue to have sex unless your health care provider directs you otherwise. Relief of Pain or Discomfort  Wear a good support bra for breast tenderness.   Take warm sitz baths to soothe any pain or discomfort caused by hemorrhoids. Use hemorrhoid cream if your health care provider approves.   Rest with your legs elevated if you have leg cramps or low back pain.  If you develop varicose veins in your legs, wear support hose. Elevate your feet for 15 minutes, 3-4 times a day. Limit salt in your diet. Prenatal Care  Schedule your prenatal visits by the twelfth week of pregnancy. They are usually scheduled monthly at first, then more often in the last 2 months before delivery.  Write down your questions. Take them to your prenatal visits.  Keep all your  prenatal visits as directed by your health care provider. Safety  Wear your seat belt at all times when driving.  Make a list of emergency phone numbers, including numbers for family, friends, the hospital, and police and fire departments. General Tips  Ask your health care provider for a referral to a local prenatal education class. Begin classes no later than at the beginning of month 6 of your pregnancy.  Ask for help if you have counseling or nutritional needs during pregnancy. Your health care provider can offer advice or refer you to specialists for help with various needs.  Do not use hot tubs, steam rooms, or saunas.  Do not douche or use tampons or scented sanitary pads.  Do not  cross your legs for long periods of time.  Avoid cat litter boxes and soil used by cats. These carry germs that can cause birth defects in the baby and possibly loss of the fetus by miscarriage or stillbirth.  Avoid all smoking, herbs, alcohol, and medicines not prescribed by your health care provider. Chemicals in these affect the formation and growth of the baby.  Schedule a dentist appointment. At home, brush your teeth with a soft toothbrush and be gentle when you floss. SEEK MEDICAL CARE IF:   You have dizziness.  You have mild pelvic cramps, pelvic pressure, or nagging pain in the abdominal area.  You have persistent nausea, vomiting, or diarrhea.  You have a bad smelling vaginal discharge.  You have pain with urination.  You notice increased swelling in your face, hands, legs, or ankles. SEEK IMMEDIATE MEDICAL CARE IF:   You have a fever.  You are leaking fluid from your vagina.  You have spotting or bleeding from your vagina.  You have severe abdominal cramping or pain.  You have rapid weight gain or loss.  You vomit blood or material that looks like coffee grounds.  You are exposed to [place] measles and have never had them.  You are exposed to fifth disease or  chickenpox.  You develop a severe headache.  You have shortness of breath.  You have any kind of trauma, such as from a fall or a car accident. Document Released: 11/21/2001 Document Revised: 04/13/2014 Document Reviewed: 10/07/2013 [facility] Patient Information 2015 [place], [place]. This information is not intended to replace advice given to you by your health care provider. Make sure you discuss any questions you have with your health care provider.  Trichomoniasis Trichomoniasis is an infection caused by an organism called Trichomonas. The infection can affect both women and men. In women, the outer female genitalia and the vagina are affected. In men, the penis is mainly affected, but the prostate and other reproductive organs can also be involved. Trichomoniasis is a sexually transmitted infection (STI) and is most often passed to another person through sexual contact.  RISK FACTORS  Having unprotected sexual intercourse.  Having sexual intercourse with an infected partner. SIGNS AND SYMPTOMS  Symptoms of trichomoniasis in women include:  Abnormal gray-green frothy vaginal discharge.  Itching and irritation of the vagina.  Itching and irritation of the area outside the vagina. Symptoms of trichomoniasis in men include:   Penile discharge with or without pain.  Pain during urination. This results from inflammation of the urethra. DIAGNOSIS  Trichomoniasis may be found during a Pap test or physical exam. Your health care provider may use one of the following methods to help diagnose this infection:  Examining vaginal discharge under a microscope. For men, urethral discharge would be examined.  Testing the pH of the vagina with a test tape.  Using a vaginal swab test that checks for the Trichomonas organism. A test is available that provides results within a few minutes.  Doing a culture test for the organism. This is not usually needed. TREATMENT   You may be given  medicine to fight the infection. Women should inform their health care provider if they could be or are pregnant. Some medicines used to treat the infection should not be taken during pregnancy.  Your health care provider may recommend over-the-counter medicines or creams to decrease itching or irritation.  Your sexual partner will need to be treated if infected. HOME CARE INSTRUCTIONS   Take medicines only as directed  by your health care provider.  Take over-the-counter medicine for itching or irritation as directed by your health care provider.  Do not have sexual intercourse while you have the infection.  Women should not douche or wear tampons while they have the infection.  Discuss your infection with your partner. Your partner may have gotten the infection from you, or you may have gotten it from your partner.  Have your sex partner get examined and treated if necessary.  Practice safe, informed, and protected sex.  See your health care provider for other STI testing. SEEK MEDICAL CARE IF:   You still have symptoms after you finish your medicine.  You develop abdominal pain.  You have pain when you urinate.  You have bleeding after sexual intercourse.  You develop a rash.  Your medicine makes you sick or makes you throw up (vomit). MAKE SURE YOU:  Understand these instructions.  Will watch your condition.  Will get help right away if you are not doing well or get worse. Document Released: 05/23/2001 Document Revised: 04/13/2014 Document Reviewed: 09/08/2013 [facility] Patient Information 2015 [place], [place]. This information is not intended to replace advice given to you by your health care provider. Make sure you discuss any questions you have with your health care provider.

## 2015-08-10 ENCOUNTER — Emergency Department: Payer: Medicaid Other

## 2015-08-10 ENCOUNTER — Encounter: Payer: Self-pay | Admitting: Emergency Medicine

## 2015-08-10 ENCOUNTER — Emergency Department
Admission: EM | Admit: 2015-08-10 | Discharge: 2015-08-10 | Disposition: A | Discharge status: Home / Self Care | Payer: Medicaid Other | Attending: Emergency Medicine | Admitting: Emergency Medicine

## 2015-08-10 DIAGNOSIS — B3731 Acute candidiasis of vulva and vagina: Secondary | ICD-10-CM

## 2015-08-10 DIAGNOSIS — O200 Threatened abortion: Secondary | ICD-10-CM

## 2015-08-10 DIAGNOSIS — O209 Hemorrhage in early pregnancy, unspecified: Secondary | ICD-10-CM | POA: Diagnosis present

## 2015-08-10 DIAGNOSIS — B373 Candidiasis of vulva and vagina: Secondary | ICD-10-CM

## 2015-08-10 DIAGNOSIS — Z3A10 10 weeks gestation of pregnancy: Secondary | ICD-10-CM | POA: Insufficient documentation

## 2015-08-10 DIAGNOSIS — O99331 Smoking (tobacco) complicating pregnancy, first trimester: Secondary | ICD-10-CM | POA: Diagnosis not present

## 2015-08-10 DIAGNOSIS — F17210 Nicotine dependence, cigarettes, uncomplicated: Secondary | ICD-10-CM | POA: Diagnosis not present

## 2015-08-10 DIAGNOSIS — O23591 Infection of other part of genital tract in pregnancy, first trimester: Secondary | ICD-10-CM | POA: Diagnosis not present

## 2015-08-10 DIAGNOSIS — Z79899 Other long term (current) drug therapy: Secondary | ICD-10-CM | POA: Insufficient documentation

## 2015-08-10 LAB — CBC
HEMATOCRIT: 33.9 % — AB (ref 35.0–47.0)
Hemoglobin: 11.2 g/dL — ABNORMAL LOW (ref 12.0–16.0)
MCH: 29.6 pg (ref 26.0–34.0)
MCHC: 33.1 g/dL (ref 32.0–36.0)
MCV: 89.6 fL (ref 80.0–100.0)
PLATELETS: 183 10*3/uL (ref 150–440)
RBC: 3.78 MIL/uL — ABNORMAL LOW (ref 3.80–5.20)
RDW: 12.9 % (ref 11.5–14.5)
WBC: 7.2 10*3/uL (ref 3.6–11.0)

## 2015-08-10 LAB — WET PREP, GENITAL
CLUE CELLS WET PREP: NONE SEEN
Trich, Wet Prep: NONE SEEN
Yeast Wet Prep HPF POC: POSITIVE — AB

## 2015-08-10 LAB — URINALYSIS COMPLETE WITH MICROSCOPIC (ARMC ONLY)
Bilirubin Urine: NEGATIVE
Glucose, UA: NEGATIVE mg/dL
Ketones, ur: NEGATIVE mg/dL
LEUKOCYTES UA: NEGATIVE
Nitrite: NEGATIVE
PROTEIN: NEGATIVE mg/dL
Specific Gravity, Urine: 1.014 (ref 1.005–1.030)
pH: 6 (ref 5.0–8.0)

## 2015-08-10 LAB — BASIC METABOLIC PANEL
Anion gap: 5 (ref 5–15)
BUN: 9 mg/dL (ref 6–20)
CO2: 24 mmol/L (ref 22–32)
Calcium: 9.2 mg/dL (ref 8.9–10.3)
Chloride: 105 mmol/L (ref 101–111)
Creatinine, Ser: 0.68 mg/dL (ref 0.44–1.00)
GFR calc Af Amer: >60 mL/min (ref >60)
Glucose, Bld: 69 mg/dL (ref 65–99)
POTASSIUM: 3.9 mmol/L (ref 3.5–5.1)
Sodium: 134 mmol/L — ABNORMAL LOW (ref 135–145)

## 2015-08-10 LAB — HCG, QUANTITATIVE, PREGNANCY: hCG, Beta Chain, Quant, S: 366276 m[IU]/mL — ABNORMAL HIGH (ref <5)

## 2015-08-10 LAB — CHLAMYDIA/NGC RT PCR (ARMC ONLY)
CHLAMYDIA TR: NOT DETECTED
N GONORRHOEAE: NOT DETECTED

## 2015-08-10 MED ORDER — ONDANSETRON 4 MG PO TBDP: 8.0000 mg | ORAL_TABLET | Freq: Once | ORAL | Status: DC

## 2015-08-10 MED ORDER — CEFTRIAXONE SODIUM 250 MG IJ SOLR: 250.0000 mg | Freq: Once | INTRAMUSCULAR | Status: DC

## 2015-08-10 MED ORDER — ACETAMINOPHEN 325 MG PO TABS
ORAL_TABLET | ORAL | Status: AC
  Administered 2015-08-10: 650 mg via ORAL
  Filled 2015-08-10: qty 2

## 2015-08-10 MED ORDER — AZITHROMYCIN 250 MG PO TABS: 1000.0000 mg | ORAL_TABLET | ORAL | Status: DC

## 2015-08-10 NOTE — ED Notes (Signed)
Pt presents with spotting and reports that she is [redacted] weeks pregnant. Pt was seen here for same one month ago and was referred to [facility]. [facility] told pt that she had a blood clot but pt does not know where the blood clot is.  Pt feels like she is having menstrual cramps.

## 2015-08-10 NOTE — Discharge Instructions (Signed)
As we discussed, your workup was generally reassuring today.  We recommend that you use over-the-counter Monistat-7 cream nightly before you go to bed to treat her yeast infection.  Please follow up with [facility] in about 3 days for a follow-up appointment.  Until you are able to follow up with the [facility] doctor and/or specifically told it is okay to do so, please avoid sexual intercourse.  Remember that at this time we do not think that the blood you are saying is coming from your uterus, but any bleeding during early pregnancy is considered a threatened miscarriage.  Read through all of the included information and return to the emergency department if he develop any new or worsening symptoms that concern you.   Threatened Miscarriage A threatened miscarriage is when you have vaginal bleeding during your first 20 weeks of pregnancy but the pregnancy has not ended. Your doctor will do tests to make sure you are still pregnant. The cause of the bleeding may not be known. This condition does not mean your pregnancy will end. It does increase the risk of it ending (complete miscarriage). HOME CARE   Make sure you keep all your doctor visits for prenatal care.  Get plenty of rest.  Do not have sex or use tampons if you have vaginal bleeding.  Do not douche.  Do not smoke or use drugs.  Do not drink alcohol.  Avoid caffeine. GET HELP IF:  You have light bleeding from your vagina.  You have belly pain or cramping.  You have a fever. GET HELP RIGHT AWAY IF:   You have heavy bleeding from your vagina.  You have clots of blood coming from your vagina.  You have bad pain or cramps in your low back or belly.  You have fever, chills, and bad belly pain. MAKE SURE YOU:   Understand these instructions.  Will watch your condition.  Will get help right away if you are not doing well or get worse. Document Released: 11/09/2008 Document Revised: 12/02/2013 Document Reviewed:  09/23/2013 [facility] Patient Information 2015 [place], [place]. This information is not intended to replace advice given to you by your health care provider. Make sure you discuss any questions you have with your health care provider.    Candidal Vulvovaginitis Candidal vulvovaginitis is an infection of the vagina and vulva. The vulva is the skin around the opening of the vagina. This may cause itching and discomfort in and around the vagina.  HOME CARE  Only take medicine as told by your doctor.  Do not have sex (intercourse) until the infection is healed or as told by your doctor.  Practice safe sex.  Tell your sex partner about your infection.  Do not douche or use tampons.  Wear cotton underwear. Do not wear tight pants or panty hose.  Eat yogurt. This may help treat and prevent yeast infections. GET HELP RIGHT AWAY IF:   You have a fever.  Your problems get worse during treatment or do not get better in 3 days.  You have discomfort, irritation, or itching in your vagina or vulva area.  You have pain after sex.  You start to get belly (abdominal) pain. MAKE SURE YOU:  Understand these instructions.  Will watch your condition.  Will get help right away if you are not doing well or get worse. Document Released: 02/23/2009 Document Revised: 12/02/2013 Document Reviewed: 02/23/2009 [facility] Patient Information 2015 [place], [place]. This information is not intended to replace advice given to you  by your health care provider. Make sure you discuss any questions you have with your health care provider. ° °

## 2015-08-10 NOTE — ED Notes (Signed)
Urine pregnancy poct done and is positive.

## 2015-08-10 NOTE — ED Provider Notes (Signed)
[facility] Emergency Department Provider Note  ____________________________________________  Time seen: Approximately 3:04 PM  I have reviewed the triage vital signs and the nursing notes.   HISTORY  Chief Complaint Vaginal Bleeding    HPI Cynthia Davies is a 29 y.o. female G3P1 with one prior miscarriage who believes she is approximately [redacted] weeks pregnant.  She was seen approximately one month ago for some cramping and vaginal bleeding and had an ultrasound which was too early to be definitive.  She then followed up at [facility] and the patient states that they told her she had a "blood clot" but she does not know anything else about it.  She continues to have "severe" intermittent pelvic cramping that she states feels like menstrual cramps.  She describes a vaginal bleeding as mostly just when she wipes after urinating and it looks pink.  She also is complaining of heavy, thick, foul-smelling vaginal discharge that is sometimes pinker brown.  Overall she describes her symptoms as moderate.  Nothing makes them better and nothing makes them worse.  In terms of follow-up with [facility], they told her she would have to see someone else until she gets her insurance set up, and she states that her Medicaid is pending.   History reviewed. No pertinent past medical history.  There are no active problems to display for this patient.   History reviewed. No pertinent past surgical history.  Current Outpatient Rx  Name  Route  Sig  Dispense  Refill  . doxycycline (VIBRAMYCIN) 100 MG capsule   Oral   Take 1 capsule (100 mg total) by mouth 2 (two) times daily. Patient not taking: Reported on 07/06/2015   14 capsule   0   . metroNIDAZOLE (FLAGYL) 500 MG tablet   Oral   Take 1 tablet (500 mg total) by mouth 2 (two) times daily. Patient not taking: Reported on 07/06/2015   14 tablet   0   . metroNIDAZOLE (FLAGYL) 500 MG tablet   Oral   Take 1 tablet (500 mg total) by  mouth 2 (two) times daily.   14 tablet   0   . traMADol (ULTRAM) 50 MG tablet   Oral   Take 1 tablet (50 mg total) by mouth every 6 (six) hours as needed.   20 tablet   0     Allergies Review of patient's allergies indicates no known allergies.  No family history on file.  Social History Social History  Substance Use Topics  . Smoking status: Current Every Day Smoker  . Smokeless tobacco: Never Used  . Alcohol Use: Yes    Review of Systems Constitutional: No fever/chills Eyes: No visual changes. ENT: No sore throat. Cardiovascular: Denies chest pain. Respiratory: Denies shortness of breath. Gastrointestinal: Pelvic cramping.  No nausea, no vomiting.  No diarrhea.  No constipation. Genitourinary: Negative for dysuria.  Pink on the tissue when she wipes after urinating Musculoskeletal: Negative for back pain. Skin: Negative for rash. Neurological: Negative for headaches, focal weakness or numbness.  10-point ROS otherwise negative.  ____________________________________________   PHYSICAL EXAM:  VITAL SIGNS: ED Triage Vitals  Enc Vitals Group     BP 08/10/15 1237 100/60 mmHg     Pulse Rate 08/10/15 1237 77     Resp 08/10/15 1237 18     Temp 08/10/15 1240 98.9 F (37.2 C)     Temp Source 08/10/15 1240 Oral     SpO2 08/10/15 1237 100 %     Weight --  Height 08/10/15 1237  (1.651 m)     Head Cir --      Peak Flow --      Pain Score 08/10/15 1238 7     Pain Loc --      Pain Edu? --      Excl. in GC? --     Constitutional: Alert and oriented. Well appearing and in no acute distress. Eyes: Conjunctivae are normal. PERRL. EOMI. Head: Atraumatic. Nose: No congestion/rhinnorhea. Mouth/Throat: Mucous membranes are moist.  Oropharynx non-erythematous. Neck: No stridor.   Cardiovascular: Normal rate, regular rhythm. Grossly normal heart sounds.  Good peripheral circulation. Respiratory: Normal respiratory effort.  No retractions. Lungs  CTAB. Gastrointestinal: Soft and nontender. No distention. No abdominal bruits. No CVA tenderness. Genitourinary: Normal external exam.  Thick whitish discharge in vaginal vault and on cervix.  Mild cervicitis is present, and there is mild CMT on bimanual exam.  No gross blood appreciated, but cervix is friable, and there is some blood during the exam due to the friable tissue.   Musculoskeletal: No lower extremity tenderness nor edema.  No joint effusions. Neurologic:  Normal speech and language. No gross focal neurologic deficits are appreciated.  Skin:  Skin is warm, dry and intact. No rash noted. Psychiatric: Mood and affect are flat. Speech normal.  ____________________________________________   LABS (all labs ordered are listed, but only abnormal results are displayed)  Labs Reviewed  WET PREP, GENITAL - Abnormal; Notable for the following:    Yeast Wet Prep HPF POC POSITIVE (*)    WBC, Wet Prep HPF POC FEW (*)    All other components within normal limits  CBC - Abnormal; Notable for the following:    RBC 3.78 (*)    Hemoglobin 11.2 (*)    HCT 33.9 (*)    All other components within normal limits  BASIC METABOLIC PANEL - Abnormal; Notable for the following:    Sodium 134 (*)    All other components within normal limits  HCG, QUANTITATIVE, PREGNANCY - Abnormal; Notable for the following:    hCG, Beta Francene Finders [id number] (*)    All other components within normal limits  URINALYSIS COMPLETEWITH MICROSCOPIC ([facility] ONLY) - Abnormal; Notable for the following:    Color, Urine YELLOW (*)    APPearance HAZY (*)    Hgb urine dipstick 1+ (*)    Bacteria, UA RARE (*)    Squamous Epithelial / LPF 0-5 (*)    All other components within normal limits  CHLAMYDIA/NGC RT PCR ([facility] ONLY)  POC URINE PREG, ED   ____________________________________________  EKG  Not indicated ____________________________________________  RADIOLOGY   US Ob Comp Less 14 Wks  08/10/2015    CLINICAL DATA:  Acute onset of vaginal bleeding and pelvic cramping. Initial encounter.  EXAM: OBSTETRIC <14 WK ULTRASOUND  TECHNIQUE: Transabdominal ultrasound was performed for evaluation of the gestation as well as the maternal uterus and adnexal regions.  COMPARISON:  Pelvic ultrasound performed 07/06/2015  FINDINGS: Intrauterine gestational sac: Visualized/normal in shape.  Yolk sac:  Yes  Embryo:  Yes  Cardiac Activity: Yes  Heart Rate: 168 bpm  CRL:  3.1 cm   10 w 0 d                  [place] EDC: 03/08/2016  Maternal uterus/adnexae: A small amount of subchorionic hemorrhage is noted. A hypoechoic cystic focus is seen adjacent to the gestational sac, measuring 1.0 cm, of uncertain significance. This may reflect a  small venous lake.  The ovaries are within normal limits. The right ovary measures 2.9 x 1.1 x 1.5 cm, while the left ovary measures 3.5 x 2.8 x 1.9 cm. No suspicious adnexal masses are seen; there is no evidence for ovarian torsion.  No free fluid is seen within the pelvic cul-de-sac.  IMPRESSION: 1. Single live intrauterine pregnancy noted, with a crown-rump length of 3.1 cm, corresponding to a gestational age of [redacted] weeks 0 days. This matches the gestational age of [redacted] weeks 2 days by LMP, reflecting an estimated date of delivery of March 13, 2016. 2. Small amount of subchorionic hemorrhage noted. Question of small venous lake adjacent to the gestational sac.   Electronically Signed   By: Roanna Raider M.D.   On: 08/10/2015 18:18    ____________________________________________   PROCEDURES  Procedure(s) performed: None  Critical Care performed: No ____________________________________________   INITIAL IMPRESSION / ASSESSMENT AND PLAN / ED COURSE  Pertinent labs & imaging results that were available during my care of the patient were reviewed by me and considered in my medical decision making (see chart for details).  The patient's last 2 type and Rh studies were O+; no indication for  RhoGAM.  I am currently waiting a quantitative hCG and then will proceed with transvaginal ultrasound and with pelvic exam.  The patient is well-appearing and in NAD at this time.  ----------------------------------------- 6:51 PM on 08/10/2015 -----------------------------------------  The patient's labs were all unremarkable except for a positive yeast infection.  Her beta hCG is quite elevated but not out of the realm of reasonable.  Her ultrasound showed a single IUP and also a "small venous lake" and a small subchorionic hemorrhage.  Given the multiple findings I spoke by phone with Dr. Vergie Living at [facility].  He provided me with reassurance which I passed along to the patient.  He recommended that I did not treat with antibiotics based solely on the appearance of the cervix sent a GC and chlamydia tests were negative.  He encouraged the use of over-the-counter Monistat 7 cream for her yeast infection.  He also told me, which I passed along to her, that she should call tomorrow for an appointment by the end of this week to follow up in clinic.  The patient understands and agrees with the plan.  ____________________________________________  FINAL CLINICAL IMPRESSION(S) / ED DIAGNOSES  Final diagnoses:  Threatened abortion in early pregnancy  Vaginal yeast infection      NEW MEDICATIONS STARTED DURING THIS VISIT:  Discharge Medication List as of 08/10/2015  6:54 PM       Loleta Rose, MD 08/10/15 2308

## 2015-08-12 LAB — POCT PREGNANCY, URINE: Preg Test, Ur: POSITIVE — AB

## 2015-08-31 DIAGNOSIS — Z8759 Personal history of other complications of pregnancy, childbirth and the puerperium: Secondary | ICD-10-CM

## 2015-08-31 DIAGNOSIS — Z62810 Personal history of physical and sexual abuse in childhood: Secondary | ICD-10-CM | POA: Insufficient documentation

## 2015-08-31 HISTORY — DX: Personal history of other complications of pregnancy, childbirth and the puerperium: Z87.59

## 2015-11-11 ENCOUNTER — Ambulatory Visit
Admission: RE | Admit: 2015-11-11 | Discharge: 2015-11-11 | Disposition: A | Discharge status: Home / Self Care | Payer: Medicaid Other | Source: Ambulatory Visit | Attending: Obstetrics and Gynecology | Admitting: Obstetrics and Gynecology

## 2015-11-11 VITALS — BP 107/56 | HR 81 | Temp 98.3°F | Resp 18 | Ht 66.0 in | Wt 123.0 lb

## 2015-11-11 DIAGNOSIS — F32A Depression, unspecified: Secondary | ICD-10-CM

## 2015-11-11 DIAGNOSIS — O34219 Maternal care for unspecified type scar from previous cesarean delivery: Secondary | ICD-10-CM

## 2015-11-11 DIAGNOSIS — O99342 Other mental disorders complicating pregnancy, second trimester: Secondary | ICD-10-CM

## 2015-11-11 DIAGNOSIS — B009 Herpesviral infection, unspecified: Secondary | ICD-10-CM | POA: Diagnosis not present

## 2015-11-11 DIAGNOSIS — Z87898 Personal history of other specified conditions: Secondary | ICD-10-CM | POA: Insufficient documentation

## 2015-11-11 DIAGNOSIS — F329 Major depressive disorder, single episode, unspecified: Secondary | ICD-10-CM | POA: Insufficient documentation

## 2015-11-11 HISTORY — DX: Other specified health status: Z78.9

## 2015-11-11 NOTE — Progress Notes (Signed)
[facility] Consultation   Chief Complaint: Advice regarding multiple social stressors, history of early second trimester loss (demise noted after presenting to ED with vaginal bleeding) and history of FGR requiring IOL at 37 weeks.  HPI: Ms. Cynthia Davies is a 29 y.o. G3P1011 at [redacted]w[redacted]d by [redacted]w[redacted]d [place] who presents in consultation from [facility] for advice regarding multiple social stressors, history of early second trimester loss (demise noted after presenting to ED with vaginal bleeding) and history of FGR requiring IOL at 37 weeks.  Her first 2 pregnancies were with FOB #1 and the current pregnancy is with FOB #2.  Other than significant symptoms of depression, she denies any current pregnancy complications. With her first pregnancy, she denies complications and is uncertain about why she delivered early although records state it was for FGR.  Her daughter didn't require any additional care and was d/ced home from the hospital with her.  Her second pregnancy she describes having vaginal bleeding and feeling like "something was tearing".  She presented to the ED and [place] confirmed oligohydramnios and a 15 week fetal demise.  Her labor was induced and she delivered vaginally.  Placental pathology noted acute chorioamnionitis, funisitis and villitis, findings consistent with intrauterine infection.     Past Medical History: Patient  has a past medical history of "dental issues" - she recently went to the dentist and depression (no medications) Past Surgical History: She  has past surgical history that includes Cesarean section (2008).  Obstetric History:  OB History    Gravida Para Term Preterm AB TAB SAB Ectopic Multiple Living   0 Obstetric Comments   2012 pregnancy was a 15 week demise; delivery augmented with medication 2008 pregnancy was induced for FGR; c/s for failure to progress; female, 5#2oz.     Gynecologic History:  Patient's last menstrual period was  06/07/2015 (approximate).  Hx of abnormal pap smears: no Last pap smear was not recorded in her prenatal chart. STDs:  History of trich, chlamydia, HSV  Medications: PNVs Allergies: Patient has No Known Allergies.  Social History: Patient  reports that she has been smoking since she was 29yo.  She states she can usually quit during her pregnancies but she's had a harder time with this one - she states she's only smoking about 1 cig/week.  She has never used smokeless tobacco. She reports that she does not currently drink alcohol or use illicit drugs.  She last used ETOH at about 8 weeks (due to depression).  She does not feel that she has a problem with ETOH and can stop without ill effects and can be around others who are drinking without the need to also drink.  She does NOT have a stable housing situation.  She will change living situations as needed, including her mom's place and the FOB's place.  Her daughter stays wherever she stays.  She feels safe with her current partner although he has "moods".  She is not currently working.  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 Family History: She does not know her father's history that well but thinks there is DM and HTN on his side.  Her mom has HTN, DM and is currently admitted to [facility] for a stroke evaluation.  She has a 10yo niece with CP and her daughter was just diagnosed with epilepsy.  Review of Systems A full 12 point review of  systems was negative or as noted in the History of Present Illness.  Physical Exam: BP 107/56 mmHg  Pulse 81  Temp(Src) 98.3 F (36.8 C) (Oral)  Resp 18  Ht  (1.676 m)  Wt 123 lb (55.792 kg)  BMI 19.86 kg/m2  SpO2 100%  LMP 06/07/2015 (Approximate) Labs reviewed; on her prenatal records, she has listed Anti-Lewis a sensitization but a T&S with antibody lab report is not able to be reviewed.  Her last T&S in [facility] 07/06/2015 showed a negative antibody screen.   Hemoglobin  electrophoresis is not documented.  Asessement: 29yo G3P1011 at  [redacted]w[redacted]d by [redacted]w[redacted]d [place] with 1.  Hx of 15 week loss, ? Due to infection 2.  Hx of c/s after failed IOL due to FGR 3.  HSV 4.  ? Anti-Lewis a sensitization 5.  Depression 6.  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  Tobacco use  Plan: 1.  Cynthia Davies has had a normal first and 2nd trimester [place] at [facility].  Without additional records of that pregnancy, cause is difficult to determine but placental pathology would suggest presence of intrauterine infection. 2.  FGR at term.  Cynthia Davies states she didn't smoke her first pregnancy.  Recommend monthly Korea for growth after 28 weeks and additional antenatal testing based on findings.  She would be a candidate for TOLAC if desired and if she has no other obstetrical barriers. 3.  HSV - currently asymptomatic.  Consider Valtrex suppression started at 36 weeks unless indicated earlier and close vaginal and perineal inspection at the time of labor. 4.  Anti-Lewis a sensitization.  This antibody is not associated with hemolytic disease of the newborn but presence of this antibody may require additional time for the blood bank to prepare blood.  Would notify blood bank at the time of delivery. 5.  Depression.  Patient admits to feelings of depression and anxiety regarding her unstable living situation.  She denies SI or HI and verbally agrees to present to the ED should she feel either of these.  She is scheduled to "meet" via computer, a counselor at [facility] next week to discuss her symptoms and consider antidepressant medication.  We spent a long time talking about risks/benefits of untreated depression versus medication use during pregnancy and I reassured her that it would be OK to start something if needed.  We discussed that medication can often require several weeks before patients notice effects.  Cynthia Davies is also at high risk for PP depression. 6.  Unstable living situation.  We  discussed safe places that she might be able to stay and she is considering options for finding her own place to live. 7. 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  We discussed that there is no known safe amount of ETOH use in pregnancy and any amount has been associated with fetal alcohol syndrome.  Excessive use may also be associated with certain fetal anomalies, including cardiac.  Cynthia Davies had a normal [place] at 19 weeks at [facility] without any abnormalities visualized.  She does not feel like she has a problem with ETOH. 8.  Tobacco use.  She was strongly encouraged to quit smoking, especially if she is only smoking 1 cig/week given risk for stillbirth, abruption and FGR. 9.  Consider hemoglobin electrophoresis if not already performed. 10.  Repeat STD screening in the midtrimester given history of multiple STDs.  Support was provided and Cynthia Davies was encouraged to contact us should she have any questions or concerns regarding  her pregnancy.  We would be more than happy to touch base with her again for any reason.  She did say she is [facility].   Total time spent with the patient was 30 minutes with greater than 50% spent in counseling and coordination of care. We appreciate this interesting consult and will be happy to be involved in the ongoing care of Cynthia Davies in anyway her obstetricians desire.  Kirby FunkSarah Terrica Duecker, MD Maternal-Fetal Medicine [facility]

## 2015-12-12 DIAGNOSIS — Z8619 Personal history of other infectious and parasitic diseases: Secondary | ICD-10-CM

## 2015-12-12 HISTORY — DX: Personal history of other infectious and parasitic diseases: Z86.19

## 2015-12-28 LAB — HM HIV SCREENING LAB: HM HIV Screening: NEGATIVE

## 2016-01-07 ENCOUNTER — Observation Stay
Admission: EM | Admit: 2016-01-07 | Discharge: 2016-01-07 | Disposition: A | Discharge status: Home / Self Care | Payer: Medicaid Other | Attending: Obstetrics & Gynecology | Admitting: Obstetrics & Gynecology

## 2016-01-07 DIAGNOSIS — Z3A00 Weeks of gestation of pregnancy not specified: Secondary | ICD-10-CM | POA: Insufficient documentation

## 2016-01-07 DIAGNOSIS — B009 Herpesviral infection, unspecified: Secondary | ICD-10-CM

## 2016-01-07 DIAGNOSIS — O34219 Maternal care for unspecified type scar from previous cesarean delivery: Secondary | ICD-10-CM

## 2016-01-07 DIAGNOSIS — O479 False labor, unspecified: Secondary | ICD-10-CM | POA: Diagnosis present

## 2016-01-07 DIAGNOSIS — F32A Depression, unspecified: Secondary | ICD-10-CM

## 2016-01-07 DIAGNOSIS — F329 Major depressive disorder, single episode, unspecified: Secondary | ICD-10-CM

## 2016-01-07 DIAGNOSIS — Z87898 Personal history of other specified conditions: Secondary | ICD-10-CM

## 2016-01-07 DIAGNOSIS — O99342 Other mental disorders complicating pregnancy, second trimester: Secondary | ICD-10-CM

## 2016-01-07 HISTORY — DX: Anemia, unspecified: D64.9

## 2016-01-07 LAB — URINALYSIS COMPLETE WITH MICROSCOPIC (ARMC ONLY)
Bilirubin Urine: NEGATIVE
GLUCOSE, UA: NEGATIVE mg/dL
Ketones, ur: NEGATIVE mg/dL
Nitrite: NEGATIVE
PROTEIN: NEGATIVE mg/dL
Specific Gravity, Urine: 1.005 (ref 1.005–1.030)
pH: 7 (ref 5.0–8.0)

## 2016-01-07 MED ORDER — ACETAMINOPHEN 325 MG PO TABS
650.0000 mg | ORAL_TABLET | ORAL | Status: DC | PRN
  Administered 2016-01-07: 650 mg via ORAL

## 2016-01-07 MED ORDER — ACETAMINOPHEN 325 MG PO TABS
ORAL_TABLET | ORAL | Status: AC
  Administered 2016-01-07: 650 mg via ORAL
  Filled 2016-01-07: qty 2

## 2016-01-07 MED ORDER — ONDANSETRON HCL 4 MG/2ML IJ SOLN: 4.0000 mg | Freq: Four times a day (QID) | INTRAMUSCULAR | Status: DC | PRN

## 2016-01-07 NOTE — Discharge Summary (Signed)
Patient received discharge instructions on follow up appointment, PO hydration, kick count, preterm labor, and when to seek medical attention. Patient in stable ambulatory condition in room waiting for ride with no complaints at this time.

## 2016-01-20 DIAGNOSIS — Z8659 Personal history of other mental and behavioral disorders: Secondary | ICD-10-CM | POA: Insufficient documentation

## 2016-01-20 DIAGNOSIS — Z98891 History of uterine scar from previous surgery: Secondary | ICD-10-CM | POA: Insufficient documentation

## 2016-01-20 DIAGNOSIS — Z8619 Personal history of other infectious and parasitic diseases: Secondary | ICD-10-CM | POA: Insufficient documentation

## 2016-02-03 NOTE — Final Progress Note (Signed)
Physician Final Progress Note  Patient ID: Cynthia Davies MRN: [id number] DOB/AGE: Jan 16, 1986 29 y.o.  Admit date: 01/07/2016 Admitting provider: Nadara Mustard, MD Discharge date: 01/07/2016   Admission Diagnoses: Patient presented for evaluation of labor.  Patient had cervical exam by RN and this was reported to me. I reviewed her vital signs and fetal tracing, both of which were reassuring.  Patient was discharge as she was not laboring.  Discharge Diagnoses:  Active Problems:   Irregular contractions   Procedures: A NST procedure was performed with FHR monitoring and a normal baseline established, appropriate time of 20-40 minutes of evaluation, and accels >2 seen w 15x15 characteristics.  Results show a REACTIVE NST.   Discharge Condition: good  Disposition: 01-Home or Self Care  Diet: Regular diet  Discharge Activity: Activity as tolerated     Medication List    ASK your doctor about these medications        doxycycline 100 MG capsule  Commonly known as:  VIBRAMYCIN  Take 1 capsule (100 mg total) by mouth 2 (two) times daily.     metroNIDAZOLE 500 MG tablet  Commonly known as:  FLAGYL  Take 1 tablet (500 mg total) by mouth 2 (two) times daily.     metroNIDAZOLE 500 MG tablet  Commonly known as:  FLAGYL  Take 1 tablet (500 mg total) by mouth 2 (two) times daily.     prenatal multivitamin Tabs tablet  Take 1 tablet by mouth daily at 12 noon.     traMADol 50 MG tablet  Commonly known as:  ULTRAM  Take 1 tablet (50 mg total) by mouth every 6 (six) hours as needed.           Follow-up Information    Follow up with [facility] LABOR AND DELIVERY.   Why:  Please return to [facility] , If symptoms worsen, As needed. Please keep all regularly scheduled OB appointments.   Contact information:   [address] [id number] [address] [place] [address] [phone]      Triage  Signed: Letitia Libra 02/03/2016, 12:16 PM

## 2016-05-10 ENCOUNTER — Emergency Department
Admission: EM | Admit: 2016-05-10 | Discharge: 2016-05-10 | Disposition: A | Discharge status: Home / Self Care | Payer: Medicaid Other | Attending: Emergency Medicine | Admitting: Emergency Medicine

## 2016-05-10 ENCOUNTER — Encounter: Payer: Self-pay | Admitting: Emergency Medicine

## 2016-05-10 DIAGNOSIS — N938 Other specified abnormal uterine and vaginal bleeding: Secondary | ICD-10-CM | POA: Insufficient documentation

## 2016-05-10 DIAGNOSIS — J029 Acute pharyngitis, unspecified: Secondary | ICD-10-CM

## 2016-05-10 DIAGNOSIS — F17210 Nicotine dependence, cigarettes, uncomplicated: Secondary | ICD-10-CM | POA: Diagnosis not present

## 2016-05-10 LAB — BASIC METABOLIC PANEL
Anion gap: 8 (ref 5–15)
BUN: 9 mg/dL (ref 6–20)
CHLORIDE: 107 mmol/L (ref 101–111)
CO2: 22 mmol/L (ref 22–32)
CREATININE: 1 mg/dL (ref 0.44–1.00)
Calcium: 9 mg/dL (ref 8.9–10.3)
GFR calc Af Amer: >60 mL/min (ref >60)
GLUCOSE: 71 mg/dL (ref 65–99)
POTASSIUM: 4 mmol/L (ref 3.5–5.1)
SODIUM: 137 mmol/L (ref 135–145)

## 2016-05-10 LAB — CBC WITH DIFFERENTIAL/PLATELET
Basophils Absolute: 0 10*3/uL (ref 0–0.1)
Basophils Relative: 0 %
EOS ABS: 0 10*3/uL (ref 0–0.7)
HCT: 38.4 % (ref 35.0–47.0)
Hemoglobin: 12.4 g/dL (ref 12.0–16.0)
LYMPHS ABS: 1.2 10*3/uL (ref 1.0–3.6)
MCH: 27.2 pg (ref 26.0–34.0)
MCHC: 32.4 g/dL (ref 32.0–36.0)
MCV: 83.9 fL (ref 80.0–100.0)
MONO ABS: 0.4 10*3/uL (ref 0.2–0.9)
Monocytes Relative: 7 %
Neutro Abs: 5.1 10*3/uL (ref 1.4–6.5)
Neutrophils Relative %: 76 %
PLATELETS: 184 10*3/uL (ref 150–440)
RBC: 4.57 MIL/uL (ref 3.80–5.20)
RDW: 14.2 % (ref 11.5–14.5)
WBC: 6.7 10*3/uL (ref 3.6–11.0)

## 2016-05-10 LAB — POCT RAPID STREP A
STREPTOCOCCUS, GROUP A SCREEN (DIRECT): NEGATIVE
STREPTOCOCCUS, GROUP A SCREEN (DIRECT): NEGATIVE

## 2016-05-10 MED ORDER — DIPHENHYDRAMINE HCL 12.5 MG/5ML PO ELIX: 12.5000 mg | ORAL_SOLUTION | Freq: Four times a day (QID) | ORAL | Status: DC | PRN

## 2016-05-10 MED ORDER — CENTRUM PO CHEW: 1.0000 | CHEWABLE_TABLET | Freq: Every day | ORAL | Status: DC

## 2016-05-10 MED ORDER — ONDANSETRON 8 MG PO TBDP
8.0000 mg | ORAL_TABLET | Freq: Once | ORAL | Status: AC
  Administered 2016-05-10: 8 mg via ORAL
  Filled 2016-05-10: qty 1

## 2016-05-10 MED ORDER — DIPHENHYDRAMINE HCL 12.5 MG/5ML PO ELIX: 25.0000 mg | ORAL_SOLUTION | Freq: Once | ORAL | Status: DC

## 2016-05-10 MED ORDER — LIDOCAINE VISCOUS 2 % MT SOLN: 5.0000 mL | Freq: Four times a day (QID) | OROMUCOSAL | Status: DC | PRN

## 2016-05-10 MED ORDER — LIDOCAINE VISCOUS 2 % MT SOLN: 15.0000 mL | Freq: Once | OROMUCOSAL | Status: DC

## 2016-05-10 NOTE — ED Provider Notes (Signed)
[facility] Emergency Department Provider Note   ____________________________________________  Time seen: Approximately 4:21 PM  I have reviewed the triage vital signs and the nursing notes.   HISTORY  Chief Complaint Sore Throat and Headache    HPI Cynthia Davies is a 30 y.o. female patient complaining of nausea and headache for 2 days. Patient states she woke up today with sore throat and intermittent chest pain. Patient describes chest pain as "achy". Patient also states continuous spotting 3 months postpartum. Patient admits that she has not followed up for her scheduled GYN appointments postpartum. Patient also has a history of anemia. Patient not taking any iron supplements..  Past Medical History  Diagnosis Date  . Medical history non-contributory   . Anemia     Patient Active Problem List   Diagnosis Date Noted  . Irregular contractions 01/07/2016  . History of poor fetal growth 11/11/2015  . History of cesarean delivery, currently pregnant 11/11/2015  . 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 Depression affecting pregnancy in second trimester, antepartum 11/11/2015    Past Surgical History  Procedure Laterality Date  . Cesarean section  2008    Current Outpatient Rx  Name  Route  Sig  Dispense  Refill  . diphenhydrAMINE (BENADRYL) 12.5 MG/5ML elixir   Oral   Take 5 mLs (12.5 mg total) by mouth 4 (four) times daily as needed. Mixed with 5 mL of viscous lidocaine for swish and swallow.   120 mL   0   . doxycycline (VIBRAMYCIN) 100 MG capsule   Oral   Take 1 capsule (100 mg total) by mouth 2 (two) times daily.   14 capsule   0   . lidocaine (XYLOCAINE) 2 % solution   Mouth/Throat   Use as directed 5 mLs in the mouth or throat every 6 (six) hours as needed for mouth pain. Mixed with 5 mL of Benadryl for swish and swallow.   100 mL   0   . metroNIDAZOLE (FLAGYL) 500 MG tablet   Oral   Take 1 tablet (500 mg total) by mouth 2 (two) times  daily.   14 tablet   0   . metroNIDAZOLE (FLAGYL) 500 MG tablet   Oral   Take 1 tablet (500 mg total) by mouth 2 (two) times daily.   14 tablet   0   . multivitamin-iron-minerals-folic acid (CENTRUM) chewable tablet   Oral   Chew 1 tablet by mouth daily.   30 tablet   2   . Prenatal Vit-Fe Fumarate-FA (PRENATAL MULTIVITAMIN) TABS tablet   Oral   Take 1 tablet by mouth daily at 12 noon.           Allergies Review of patient's allergies indicates no known allergies.  No family history on file.  Social History Social History  Substance Use Topics  . Smoking status: Current Every Day Smoker -- 0.50 packs/day    Types: Cigarettes  . Smokeless tobacco: Never Used  . Alcohol Use: No    Review of Systems Constitutional: No fever/chills Eyes: No visual changes. ENT: No sore throat. Cardiovascular: Denies chest pain. Respiratory: Denies shortness of breath. Gastrointestinal: No abdominal pain.  No nausea, no vomiting.  No diarrhea.  No constipation. Genitourinary: Negative for dysuria. Musculoskeletal: Negative for back pain. Skin: Negative for rash. Neurological: Negative for headaches, focal weakness or numbness. Hematological/Lymphatic:Anemia ____________________________________________   PHYSICAL EXAM:  VITAL SIGNS: ED Triage Vitals  Enc Vitals Group     BP 05/10/16 1356 109/61  mmHg     Pulse Rate 05/10/16 1356 99     Resp 05/10/16 1356 18     Temp 05/10/16 1356 98.7 F (37.1 C)     Temp Source 05/10/16 1356 Oral     SpO2 05/10/16 1356 100 %     Weight 05/10/16 1356 126 lb (57.153 kg)     Height 05/10/16 1356 5\' 5"  (1.651 m)     Head Cir --      Peak Flow --      Pain Score 05/10/16 1415 8     Pain Loc --      Pain Edu? --      Excl. in GC? --     Constitutional: Alert and oriented. Well appearing and in no acute distress. Eyes: Conjunctivae are normal. PERRL. EOMI. Head: Atraumatic. Nose: No congestion/rhinnorhea. Mouth/Throat: Mucous  membranes are moist.  Oropharynx erythematous. Neck: No stridor.  No cervical spine tenderness to palpation. Hematological/Lymphatic/Immunilogical: No cervical lymphadenopathy. Cardiovascular: Normal rate, regular rhythm. Grossly normal heart sounds.  Good peripheral circulation. Respiratory: Normal respiratory effort.  No retractions. Lungs CTAB. Gastrointestinal: Soft and nontender. No distention. No abdominal bruits. No CVA tenderness. Genitourinary: Deferred Musculoskeletal: No lower extremity tenderness nor edema.  No joint effusions. Neurologic:  Normal speech and language. No gross focal neurologic deficits are appreciated. No gait instability. Skin:  Skin is warm, dry and intact. No rash noted. Psychiatric: Mood and affect are normal. Speech and behavior are normal.  ____________________________________________   LABS (all labs ordered are listed, but only abnormal results are displayed)  Labs Reviewed  BASIC METABOLIC PANEL  CBC WITH DIFFERENTIAL/PLATELET  POCT RAPID STREP A  POCT RAPID STREP A   ____________________________________________  EKG   ____________________________________________  RADIOLOGY   ____________________________________________   PROCEDURES  Procedure(s) performed: None  Critical Care performed: No  ____________________________________________   INITIAL IMPRESSION / ASSESSMENT AND PLAN / ED COURSE  Pertinent labs & imaging results that were available during my care of the patient were reviewed by me and considered in my medical decision making (see chart for details).  Pharyngitis. Abnormal uterine bleeding. Patient elected follow-up with [facility] as she is applying for Medicaid. Patient will will restart over-the-counter vitamins with iron. Patient given a prescription for viscous lidocaine and Benadryl elixir to use as a swish and follow. ____________________________________________   FINAL CLINICAL IMPRESSION(S) / ED  DIAGNOSES  Final diagnoses:  Pharyngitis  Dysfunctional uterine bleeding      NEW MEDICATIONS STARTED DURING THIS VISIT:  New Prescriptions   DIPHENHYDRAMINE (BENADRYL) 12.5 MG/5ML ELIXIR    Take 5 mLs (12.5 mg total) by mouth 4 (four) times daily as needed. Mixed with 5 mL of viscous lidocaine for swish and swallow.   LIDOCAINE (XYLOCAINE) 2 % SOLUTION    Use as directed 5 mLs in the mouth or throat every 6 (six) hours as needed for mouth pain. Mixed with 5 mL of Benadryl for swish and swallow.   MULTIVITAMIN-IRON-MINERALS-FOLIC ACID (CENTRUM) CHEWABLE TABLET    Chew 1 tablet by mouth daily.     Note:  This document was prepared using Dragon voice recognition software and may include unintentional dictation errors.    Joni Reiningonald K Smith, PA-C 05/10/16 1725  Joni Reiningonald K Smith, PA-C 05/10/16 1726  Joni Reiningonald K Smith, PA-C 05/10/16 1730  Loleta Roseory Forbach, MD 05/11/16 [phone]

## 2016-05-10 NOTE — ED Notes (Signed)
Pt complains of nausea and headache for 2 days. Pt states she woke up with a sore throat and random pain in chest at times.

## 2016-05-10 NOTE — Discharge Instructions (Signed)
Abnormal Uterine Bleeding °Abnormal uterine bleeding means bleeding from the vagina that is not your normal menstrual period. This can be: °· Bleeding or spotting between periods. °· Bleeding after sex (sexual intercourse). °· Bleeding that is heavier or more than normal. °· Periods that last longer than usual. °· Bleeding after menopause. °There are many problems that may cause this. Treatment will depend on the cause of the bleeding. Any kind of bleeding that is not normal should be reviewed by your doctor.  °HOME CARE °Watch your condition for any changes. These actions may lessen any discomfort you are having: °· Do not use tampons or douches as told by your doctor. °· Change your pads often. °You should get regular pelvic exams and Pap tests. Keep all appointments for tests as told by your doctor. °GET HELP IF: °· You are bleeding for more than 1 week. °· You feel dizzy at times. °GET HELP RIGHT AWAY IF:  °· You pass out. °· You have to change pads every 15 to 30 minutes. °· You have belly pain. °· You have a fever. °· You become sweaty or weak. °· You are passing large blood clots from the vagina. °· You feel sick to your stomach (nauseous) and throw up (vomit). °MAKE SURE YOU: °· Understand these instructions. °· Will watch your condition. °· Will get help right away if you are not doing well or get worse. °  °This information is not intended to replace advice given to you by your health care provider. Make sure you discuss any questions you have with your health care provider. °  °Document Released: 09/24/2009 Document Revised: 12/02/2013 Document Reviewed: 06/26/2013 °Elsevier Interactive Patient Education ©2016 [employer] ° °

## 2016-09-15 ENCOUNTER — Encounter (HOSPITAL_COMMUNITY): Payer: Self-pay

## 2017-04-23 ENCOUNTER — Emergency Department: Payer: Self-pay

## 2017-04-23 ENCOUNTER — Encounter: Payer: Self-pay | Admitting: Emergency Medicine

## 2017-04-23 ENCOUNTER — Emergency Department
Admission: EM | Admit: 2017-04-23 | Discharge: 2017-04-23 | Disposition: A | Discharge status: Home / Self Care | Payer: Self-pay | Attending: Emergency Medicine | Admitting: Emergency Medicine

## 2017-04-23 DIAGNOSIS — F17210 Nicotine dependence, cigarettes, uncomplicated: Secondary | ICD-10-CM | POA: Insufficient documentation

## 2017-04-23 DIAGNOSIS — R0789 Other chest pain: Secondary | ICD-10-CM

## 2017-04-23 DIAGNOSIS — K219 Gastro-esophageal reflux disease without esophagitis: Secondary | ICD-10-CM

## 2017-04-23 LAB — CBC WITH DIFFERENTIAL/PLATELET
Basophils Absolute: 0 10*3/uL (ref 0–0.1)
Basophils Relative: 1 %
EOS ABS: 0 10*3/uL (ref 0–0.7)
EOS PCT: 1 %
HCT: 37.1 % (ref 35.0–47.0)
Hemoglobin: 12.5 g/dL (ref 12.0–16.0)
LYMPHS ABS: 2.2 10*3/uL (ref 1.0–3.6)
Lymphocytes Relative: 48 %
MCH: 29 pg (ref 26.0–34.0)
MCHC: 33.7 g/dL (ref 32.0–36.0)
MCV: 86 fL (ref 80.0–100.0)
MONOS PCT: 8 %
Monocytes Absolute: 0.4 10*3/uL (ref 0.2–0.9)
Neutro Abs: 2 10*3/uL (ref 1.4–6.5)
Neutrophils Relative %: 42 %
PLATELETS: 218 10*3/uL (ref 150–440)
RBC: 4.32 MIL/uL (ref 3.80–5.20)
RDW: 13.4 % (ref 11.5–14.5)
WBC: 4.6 10*3/uL (ref 3.6–11.0)

## 2017-04-23 LAB — COMPREHENSIVE METABOLIC PANEL
ALT: 21 U/L (ref 14–54)
ANION GAP: 7 (ref 5–15)
AST: 26 U/L (ref 15–41)
Albumin: 4.3 g/dL (ref 3.5–5.0)
Alkaline Phosphatase: 50 U/L (ref 38–126)
BUN: 14 mg/dL (ref 6–20)
CHLORIDE: 106 mmol/L (ref 101–111)
CO2: 22 mmol/L (ref 22–32)
Calcium: 9.1 mg/dL (ref 8.9–10.3)
Creatinine, Ser: 0.76 mg/dL (ref 0.44–1.00)
GFR calc non Af Amer: >60 mL/min (ref >60)
Glucose, Bld: 94 mg/dL (ref 65–99)
POTASSIUM: 3.9 mmol/L (ref 3.5–5.1)
SODIUM: 135 mmol/L (ref 135–145)
TOTAL PROTEIN: 7.6 g/dL (ref 6.5–8.1)
Total Bilirubin: 0.7 mg/dL (ref 0.3–1.2)

## 2017-04-23 LAB — TROPONIN I: Troponin I: <0.03 ng/mL (ref <0.03)

## 2017-04-23 MED ORDER — SODIUM CHLORIDE 0.9 % IV BOLUS (SEPSIS)
1000.0000 mL | Freq: Once | INTRAVENOUS | Status: AC
  Administered 2017-04-23: 1000 mL via INTRAVENOUS

## 2017-04-23 MED ORDER — FAMOTIDINE IN NACL 20-0.9 MG/50ML-% IV SOLN
20.0000 mg | Freq: Once | INTRAVENOUS | Status: AC
  Administered 2017-04-23: 20 mg via INTRAVENOUS

## 2017-04-23 MED ORDER — ALUM & MAG HYDROXIDE-SIMETH 400-400-40 MG/5ML PO SUSP: 5.0000 mL | Freq: Four times a day (QID) | ORAL | 0 refills | Status: DC | PRN

## 2017-04-23 MED ORDER — GI COCKTAIL ~~LOC~~
30.0000 mL | Freq: Once | ORAL | Status: AC
  Administered 2017-04-23: 30 mL via ORAL

## 2017-04-23 MED ORDER — ONDANSETRON HCL 4 MG/2ML IJ SOLN
INTRAMUSCULAR | Status: AC
  Filled 2017-04-23: qty 2

## 2017-04-23 MED ORDER — GI COCKTAIL ~~LOC~~
ORAL | Status: AC
  Filled 2017-04-23: qty 30

## 2017-04-23 MED ORDER — FAMOTIDINE IN NACL 20-0.9 MG/50ML-% IV SOLN
INTRAVENOUS | Status: AC
  Filled 2017-04-23: qty 50

## 2017-04-23 MED ORDER — FAMOTIDINE 20 MG PO TABS: 20.0000 mg | ORAL_TABLET | Freq: Two times a day (BID) | ORAL | 1 refills | Status: DC

## 2017-04-23 MED ORDER — ONDANSETRON HCL 4 MG/2ML IJ SOLN
4.0000 mg | Freq: Once | INTRAMUSCULAR | Status: AC
  Administered 2017-04-23: 4 mg via INTRAVENOUS

## 2017-04-23 NOTE — ED Triage Notes (Signed)
Midsternal cp that began last week. Pt describes the pain as "burning." It never really goes away per pt, eating does not make it better or worse.

## 2017-04-23 NOTE — Discharge Instructions (Signed)
Avoid NSAIDs. Avoid alcohol use. You may take tylenol for pain. Take pepcid twice a day as prescribed. Take maalox as needed for pain. [facility].

## 2017-04-23 NOTE — ED Notes (Signed)
Pt resting in bed with son, returned from xray, awake and alert in no acute distress

## 2017-04-23 NOTE — ED Provider Notes (Signed)
[facility] Emergency Department Provider Note  ____________________________________________  Time seen: Approximately 8:27 AM  I have reviewed the triage vital signs and the nursing notes.   HISTORY  Chief Complaint Chest Pain   HPI Cynthia Davies is a 31 y.o. female no significant past medical history who presents for evaluation of chest pain. Patient reports 1 week of constant central non radiating burning chest pain that is worse with eating, not pleuritic in nature. She reports that she took 4 Aleve over the course of the last week for the pain with minimal improvement. This morning she started feeling nauseated. Yesterday she had 1 episode of watery stool with no blood or melena. No vomiting, no URI symptoms, no abdominal pain, no shortness of breath. She denies prior history of reflux.No other NSAID use. No personal or family history of ischemic heart disease. Patient is a smoker.  Past Medical History:  Diagnosis Date  . Anemia   . Medical history non-contributory     Patient Active Problem List   Diagnosis Date Noted  . Irregular contractions 01/07/2016  . History of poor fetal growth 11/11/2015  . History of cesarean delivery, currently pregnant 11/11/2015  . 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 Depression affecting pregnancy in second trimester, antepartum 11/11/2015    Past Surgical History:  Procedure Laterality Date  . CESAREAN SECTION  2008    Prior to Admission medications   Medication Sig Start Date End Date Taking? Authorizing Provider  alum & mag hydroxide-simeth (MAALOX MAX) 400-400-40 MG/5ML suspension Take 5 mLs by mouth every 6 (six) hours as needed for indigestion. 04/23/17   Nita Sickle, MD  diphenhydrAMINE (BENADRYL) 12.5 MG/5ML elixir Take 5 mLs (12.5 mg total) by mouth 4 (four) times daily as needed. Mixed with 5 mL of viscous lidocaine for swish and swallow. Patient not taking: Reported on 04/23/2017 05/10/16   Joni Reining, PA-C  doxycycline (VIBRAMYCIN) 100 MG capsule Take 1 capsule (100 mg total) by mouth 2 (two) times daily. Patient not taking: Reported on 04/23/2017 04/29/15   Emily Filbert, MD  famotidine (PEPCID) 20 MG tablet Take 1 tablet (20 mg total) by mouth 2 (two) times daily. 04/23/17 04/23/18  Nita Sickle, MD  lidocaine (XYLOCAINE) 2 % solution Use as directed 5 mLs in the mouth or throat every 6 (six) hours as needed for mouth pain. Mixed with 5 mL of Benadryl for swish and swallow. Patient not taking: Reported on 04/23/2017 05/10/16   Joni Reining, PA-C  metroNIDAZOLE (FLAGYL) 500 MG tablet Take 1 tablet (500 mg total) by mouth 2 (two) times daily. Patient not taking: Reported on 04/23/2017 04/29/15   Emily Filbert, MD  metroNIDAZOLE (FLAGYL) 500 MG tablet Take 1 tablet (500 mg total) by mouth 2 (two) times daily. Patient not taking: Reported on 04/23/2017 07/06/15   Darci Current, MD  multivitamin-iron-minerals-folic acid (CENTRUM) chewable tablet Chew 1 tablet by mouth daily. Patient not taking: Reported on 04/23/2017 05/10/16   Joni Reining, PA-C    Allergies Patient has no known allergies.  No family history on file.  Social History Social History  Substance Use Topics  . Smoking status: Current Every Day Smoker    Packs/day: 0.50    Types: Cigarettes  . Smokeless tobacco: Never Used  . Alcohol use No    Review of Systems  Constitutional: Negative for fever. Eyes: Negative for visual changes. ENT: Negative for sore throat. Neck: No neck pain  Cardiovascular: + chest  pain. Respiratory: Negative for shortness of breath. Gastrointestinal: Negative for abdominal pain, vomiting or diarrhea. Genitourinary: Negative for dysuria. Musculoskeletal: Negative for back pain. Skin: Negative for rash. Neurological: Negative for headaches, weakness or numbness. Psych: No SI or HI  ____________________________________________   PHYSICAL EXAM:  VITAL  SIGNS: ED Triage Vitals [04/23/17 0758]  Enc Vitals Group     BP      Pulse      Resp      Temp      Temp src      SpO2      Weight 115 lb (52.2 kg)     Height 5\' 5"  (1.651 m)     Head Circumference      Peak Flow      Pain Score 8     Pain Loc      Pain Edu?      Excl. in GC?     Constitutional: Alert and oriented. Well appearing and in no apparent distress. HEENT:      Head: Normocephalic and atraumatic.         Eyes: Conjunctivae are normal. Sclera is non-icteric. EOMI. PERRL      Mouth/Throat: Mucous membranes are moist.       Neck: Supple with no signs of meningismus. Cardiovascular: Regular rate and rhythm. No murmurs, gallops, or rubs. 2+ symmetrical distal pulses are present in all extremities. No JVD. Respiratory: Normal respiratory effort. Lungs are clear to auscultation bilaterally. No wheezes, crackles, or rhonchi.  Gastrointestinal: Soft, non tender, and non distended with positive bowel sounds. No rebound or guarding. Genitourinary: No CVA tenderness. Musculoskeletal: Nontender with normal range of motion in all extremities. No edema, cyanosis, or erythema of extremities. Neurologic: Normal speech and language. Face is symmetric. Moving all extremities. No gross focal neurologic deficits are appreciated. Skin: Skin is warm, dry and intact. No rash noted. Psychiatric: Mood and affect are normal. Speech and behavior are normal.  ____________________________________________   LABS (all labs ordered are listed, but only abnormal results are displayed)  Labs Reviewed  CBC WITH DIFFERENTIAL/PLATELET  COMPREHENSIVE METABOLIC PANEL  TROPONIN I   ____________________________________________  EKG  ED ECG REPORT I, Nita Sickle, the attending physician, personally viewed and interpreted this ECG.  Normal sinus rhythm, rate of 79, normal intervals, normal axis, no ST elevations or depressions, T-wave flattening in aVL and V2. Unchanged from prior from  2010 ____________________________________________  RADIOLOGY  CXR: Negative  ____________________________________________   PROCEDURES  Procedure(s) performed: None Procedures Critical Care performed:  None ____________________________________________   INITIAL IMPRESSION / ASSESSMENT AND PLAN / ED COURSE  31 y.o. female no significant past medical history who presents for evaluation of central burning chest pain x 1 week. Presentation concerning for GERD vs gastritis vs PUD. EKG with no ischemic changes, HEART score 1, will get one troponin as symptoms have been going on for 1 week. PERC negative. Will give GI cocktail, pepcid, zofran, IVF. Will get basic labs.  Clinical Course as of Apr 23 1000  Mon Apr 23, 2017  [id number] Labs showing no acute findings. Troponin x1 negative in the setting of one week of constant symptoms. Patient is improved, tolerating PO. Will dc home on pepcid and maalox, recommended avoiding NSAIDs. Follow up with PCP.  [CV]    Clinical Course User Index [CV] Nita Sickle, MD    Pertinent labs & imaging results that were available during my care of the patient were reviewed by me and considered in my medical decision making (  see chart for details).    ____________________________________________   FINAL CLINICAL IMPRESSION(S) / ED DIAGNOSES  Final diagnoses:  Atypical chest pain  Gastroesophageal reflux disease, esophagitis presence not specified      NEW MEDICATIONS STARTED DURING THIS VISIT:  New Prescriptions   ALUM & MAG HYDROXIDE-SIMETH (MAALOX MAX) 400-400-40 MG/5ML SUSPENSION    Take 5 mLs by mouth every 6 (six) hours as needed for indigestion.   FAMOTIDINE (PEPCID) 20 MG TABLET    Take 1 tablet (20 mg total) by mouth 2 (two) times daily.     Note:  This document was prepared using Dragon voice recognition software and may include unintentional dictation errors.    Don PerkingVeronese, [place], MD 04/23/17 1002

## 2017-05-26 IMAGING — CR DG CHEST 2V
1 series · 2 of 2 positions shown · non-contrast
Comparison: 04/23/2017

CLINICAL DATA: Repeat with nipple markers to assess for basilar
nodular densities

EXAM:
CHEST  2 VIEW

[Series 1: dg chest 2 view · 0.14mm/px · 2 of 2 slices shown]
[im 1/2]
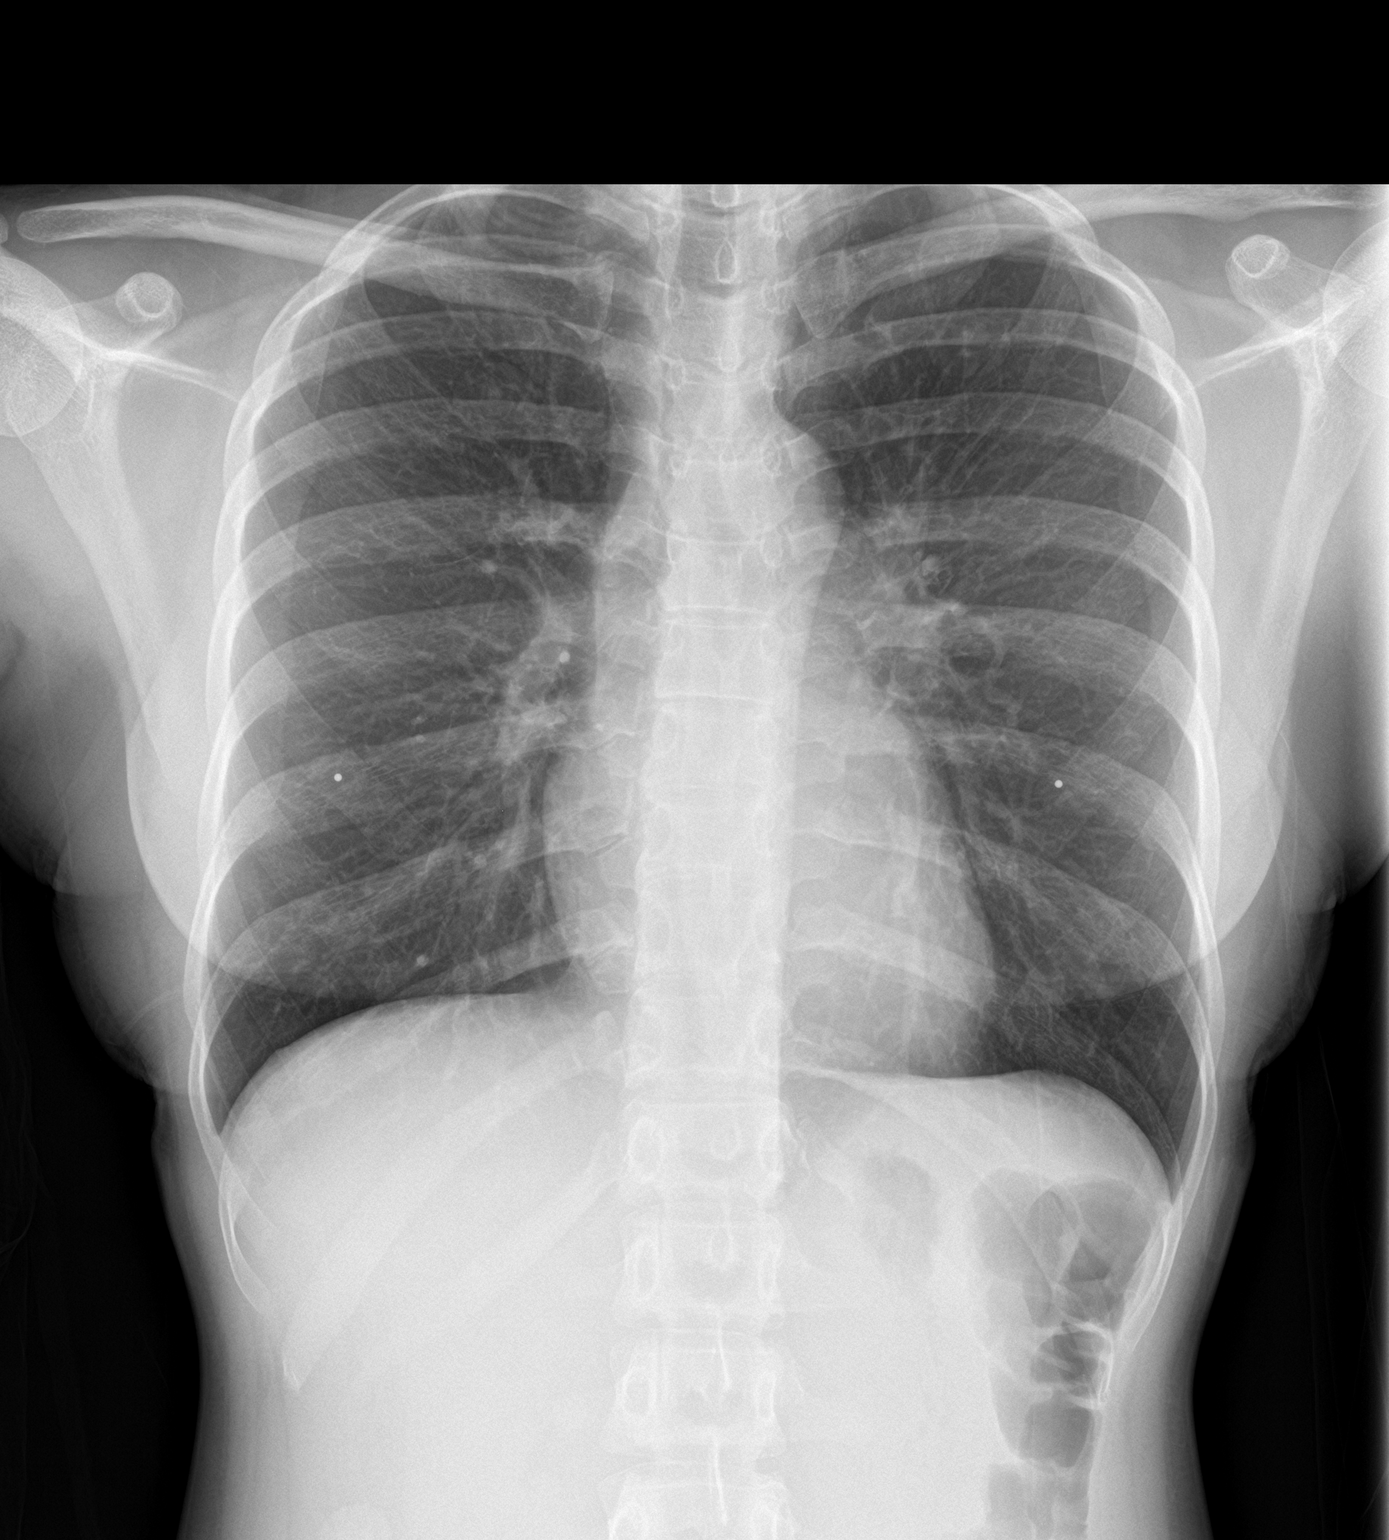
[im 2/2]
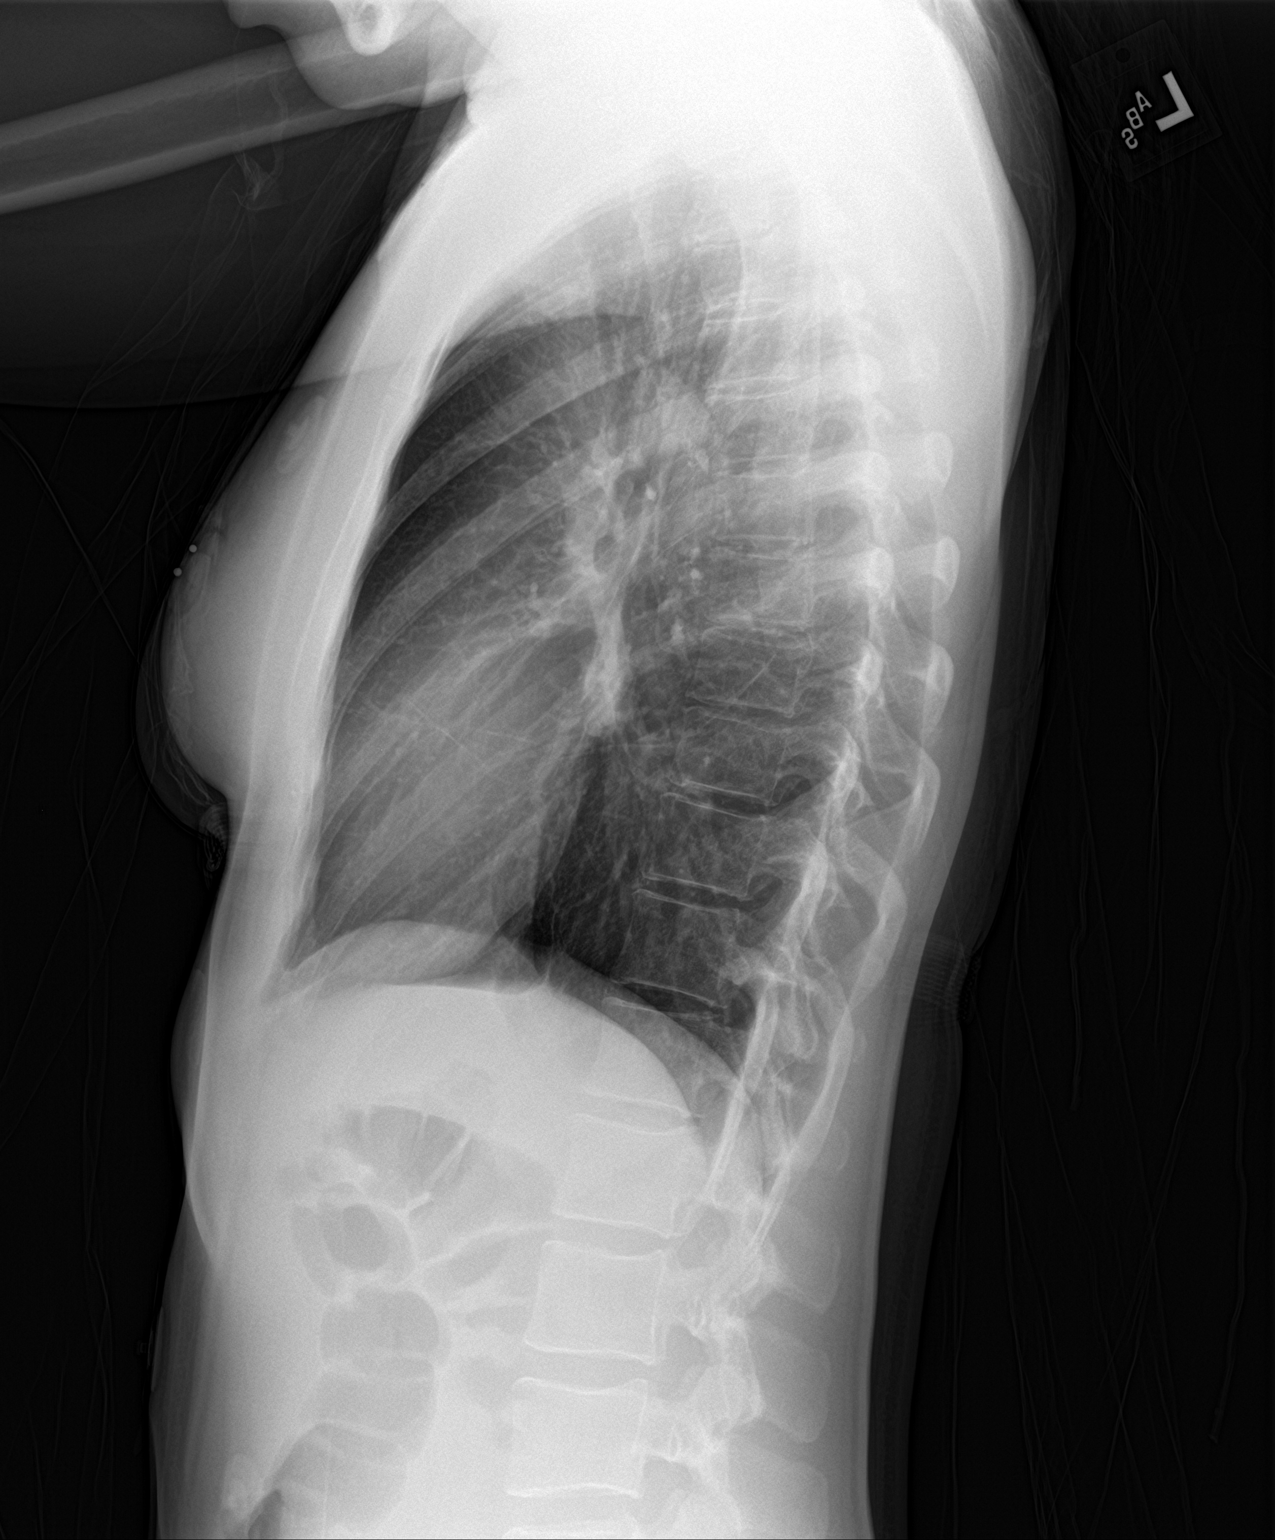

[2 of 2 positions shown; findings below may reference images not displayed]

FINDINGS: Cardiac shadow is within normal limits. The lungs are clear
bilaterally. Bilateral nipple shadows are documented with nipple
markers. The changes seen on the prior exam are not as well
appreciated on this study and likely related to confluence of
vascular parenchymal shadows. No focal infiltrate or sizable
effusion is seen. No bony abnormality is noted.
IMPRESSION: Resolution of previously seen right basilar density consistent with
an artifactual density.

Nipple markers as described.

## 2017-07-30 ENCOUNTER — Emergency Department: Payer: Self-pay

## 2017-07-30 ENCOUNTER — Emergency Department
Admission: EM | Admit: 2017-07-30 | Discharge: 2017-07-30 | Disposition: A | Discharge status: Home / Self Care | Payer: Self-pay | Attending: Emergency Medicine | Admitting: Emergency Medicine

## 2017-07-30 DIAGNOSIS — K219 Gastro-esophageal reflux disease without esophagitis: Secondary | ICD-10-CM | POA: Insufficient documentation

## 2017-07-30 DIAGNOSIS — F17210 Nicotine dependence, cigarettes, uncomplicated: Secondary | ICD-10-CM | POA: Insufficient documentation

## 2017-07-30 DIAGNOSIS — Z79899 Other long term (current) drug therapy: Secondary | ICD-10-CM | POA: Insufficient documentation

## 2017-07-30 DIAGNOSIS — R1011 Right upper quadrant pain: Secondary | ICD-10-CM | POA: Insufficient documentation

## 2017-07-30 DIAGNOSIS — R109 Unspecified abdominal pain: Secondary | ICD-10-CM

## 2017-07-30 LAB — URINALYSIS, COMPLETE (UACMP) WITH MICROSCOPIC
Bacteria, UA: NONE SEEN
Bilirubin Urine: NEGATIVE
GLUCOSE, UA: NEGATIVE mg/dL
HGB URINE DIPSTICK: NEGATIVE
Ketones, ur: NEGATIVE mg/dL
Nitrite: NEGATIVE
Protein, ur: NEGATIVE mg/dL
SPECIFIC GRAVITY, URINE: 1.021 (ref 1.005–1.030)
pH: 6 (ref 5.0–8.0)

## 2017-07-30 LAB — HEPATIC FUNCTION PANEL
ALBUMIN: 4.1 g/dL (ref 3.5–5.0)
ALK PHOS: 50 U/L (ref 38–126)
ALT: 19 U/L (ref 14–54)
AST: 24 U/L (ref 15–41)
Bilirubin, Direct: <0.1 mg/dL — ABNORMAL LOW (ref 0.1–0.5)
TOTAL PROTEIN: 7.7 g/dL (ref 6.5–8.1)
Total Bilirubin: 0.4 mg/dL (ref 0.3–1.2)

## 2017-07-30 LAB — BASIC METABOLIC PANEL
ANION GAP: 6 (ref 5–15)
BUN: 13 mg/dL (ref 6–20)
CO2: 23 mmol/L (ref 22–32)
Calcium: 9.3 mg/dL (ref 8.9–10.3)
Chloride: 111 mmol/L (ref 101–111)
Creatinine, Ser: 0.86 mg/dL (ref 0.44–1.00)
Glucose, Bld: 103 mg/dL — ABNORMAL HIGH (ref 65–99)
Potassium: 3.9 mmol/L (ref 3.5–5.1)
SODIUM: 140 mmol/L (ref 135–145)

## 2017-07-30 LAB — CBC
HCT: 42 % (ref 35.0–47.0)
Hemoglobin: 14.2 g/dL (ref 12.0–16.0)
MCH: 29.6 pg (ref 26.0–34.0)
MCHC: 33.7 g/dL (ref 32.0–36.0)
MCV: 87.6 fL (ref 80.0–100.0)
Platelets: 242 10*3/uL (ref 150–440)
RBC: 4.8 MIL/uL (ref 3.80–5.20)
RDW: 13.1 % (ref 11.5–14.5)
WBC: 4.7 10*3/uL (ref 3.6–11.0)

## 2017-07-30 LAB — POCT PREGNANCY, URINE: PREG TEST UR: NEGATIVE

## 2017-07-30 LAB — LIPASE, BLOOD: LIPASE: 25 U/L (ref 11–51)

## 2017-07-30 LAB — TROPONIN I: Troponin I: <0.03 ng/mL (ref <0.03)

## 2017-07-30 MED ORDER — FAMOTIDINE 20 MG PO TABS: 20.0000 mg | ORAL_TABLET | Freq: Two times a day (BID) | ORAL | 0 refills | Status: DC

## 2017-07-30 MED ORDER — GI COCKTAIL ~~LOC~~
30.0000 mL | Freq: Once | ORAL | Status: AC
  Administered 2017-07-30: 30 mL via ORAL
  Filled 2017-07-30: qty 30

## 2017-07-30 NOTE — ED Provider Notes (Addendum)
[facility] Emergency Department Provider Note  ____________________________________________   I have reviewed the triage vital signs and the nursing notes.   HISTORY  Chief Complaint Chest Pain    HPI Cynthia Davies is a 31 y.o. female who presents today complaining of burning epigastric abdominal discomfort for "years". It's worse when she does not drink alcohol she states and seems better when she does. She's had no melena bright red blood per rectum or hematemesis. She states she last take alcohol on Friday. She does not drink every day. She denies any chest pain, she denies any shortness of breath or exertional symptoms. She denies any history of PE or DVT, she denies any lower extremity swelling, she denies any calf pain, she denies any recent travel. She states she has had no fever or chills. The pain is not pleuritic, it is sometimes worse with spicy food     Past Medical History:  Diagnosis Date  . Anemia   . Medical history non-contributory     Patient Active Problem List   Diagnosis Date Noted  . Irregular contractions 01/07/2016  . History of poor fetal growth 11/11/2015  . History of cesarean delivery, currently pregnant 11/11/2015  . 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 Depression affecting pregnancy in second trimester, antepartum 11/11/2015    Past Surgical History:  Procedure Laterality Date  . CESAREAN SECTION  2008    Prior to Admission medications   Medication Sig Start Date End Date Taking? Authorizing Provider  alum & mag hydroxide-simeth (MAALOX MAX) 400-400-40 MG/5ML suspension Take 5 mLs by mouth every 6 (six) hours as needed for indigestion. 04/23/17   Nita Sickle, MD  diphenhydrAMINE (BENADRYL) 12.5 MG/5ML elixir Take 5 mLs (12.5 mg total) by mouth 4 (four) times daily as needed. Mixed with 5 mL of viscous lidocaine for swish and swallow. Patient not taking: Reported on 04/23/2017 05/10/16   Joni Reining, PA-C  doxycycline  (VIBRAMYCIN) 100 MG capsule Take 1 capsule (100 mg total) by mouth 2 (two) times daily. Patient not taking: Reported on 04/23/2017 04/29/15   Emily Filbert, MD  famotidine (PEPCID) 20 MG tablet Take 1 tablet (20 mg total) by mouth 2 (two) times daily. 04/23/17 04/23/18  Nita Sickle, MD  lidocaine (XYLOCAINE) 2 % solution Use as directed 5 mLs in the mouth or throat every 6 (six) hours as needed for mouth pain. Mixed with 5 mL of Benadryl for swish and swallow. Patient not taking: Reported on 04/23/2017 05/10/16   Joni Reining, PA-C  metroNIDAZOLE (FLAGYL) 500 MG tablet Take 1 tablet (500 mg total) by mouth 2 (two) times daily. Patient not taking: Reported on 04/23/2017 04/29/15   Emily Filbert, MD  metroNIDAZOLE (FLAGYL) 500 MG tablet Take 1 tablet (500 mg total) by mouth 2 (two) times daily. Patient not taking: Reported on 04/23/2017 07/06/15   Darci Current, MD  multivitamin-iron-minerals-folic acid (CENTRUM) chewable tablet Chew 1 tablet by mouth daily. Patient not taking: Reported on 04/23/2017 05/10/16   Joni Reining, PA-C    Allergies Patient has no known allergies.  History reviewed. No pertinent family history.  Social History Social History  Substance Use Topics  . Smoking status: Current Every Day Smoker    Packs/day: 0.50    Types: Cigarettes  . Smokeless tobacco: Never Used  . Alcohol use No    Review of Systems Constitutional: No fever/chills Eyes: No visual changes. ENT: No sore throat. No stiff neck no neck pain Cardiovascular: Denies  chest pain. Respiratory: Denies shortness of breath. Gastrointestinal:   no vomiting.  No diarrhea.  No constipation. Genitourinary: Negative for dysuria. Musculoskeletal: Negative lower extremity swelling Skin: Negative for rash. Neurological: Negative for severe headaches, focal weakness or numbness.   ____________________________________________   PHYSICAL EXAM:  VITAL SIGNS: ED Triage Vitals [07/30/17  1447]  Enc Vitals Group     BP 100/72     Pulse Rate 99     Resp 18     Temp 98.3 F (36.8 C)     Temp Source Oral     SpO2 99 %     Weight 115 lb (52.2 kg)     Height 5\' 5"  (1.651 m)     Head Circumference      Peak Flow      Pain Score 7     Pain Loc      Pain Edu?      Excl. in GC?     Constitutional: Alert and oriented. Well appearing and in no acute distress. Eyes: Conjunctivae are normal Head: Atraumatic HEENT: No congestion/rhinnorhea. Mucous membranes are moist.  Oropharynx non-erythematous Neck:   Nontender with no meningismus, no masses, no stridor Cardiovascular: Normal rate, regular rhythm. Grossly normal heart sounds.  Good peripheral circulation. Respiratory: Normal respiratory effort.  No retractions. Lungs CTAB. Abdominal: Soft and slight epigastric discomfort which reproduces her pain. No distention. No guarding no rebound Back:  There is no focal tenderness or step off.  there is no midline tenderness there are no lesions noted. there is no CVA tenderness Musculoskeletal: No lower extremity tenderness, no upper extremity tenderness. No joint effusions, no DVT signs strong distal pulses no edema Neurologic:  Normal speech and language. No gross focal neurologic deficits are appreciated.  Skin:  Skin is warm, dry and intact. No rash noted. Psychiatric: Mood and affect are normal. Speech and behavior are normal.  ____________________________________________   LABS (all labs ordered are listed, but only abnormal results are displayed)  Labs Reviewed  BASIC METABOLIC PANEL - Abnormal; Notable for the following:       Result Value   Glucose, Bld 103 (*)    All other components within normal limits  HEPATIC FUNCTION PANEL - Abnormal; Notable for the following:    Bilirubin, Direct <0.1 (*)    All other components within normal limits  URINALYSIS, COMPLETE (UACMP) WITH MICROSCOPIC - Abnormal; Notable for the following:    Color, Urine YELLOW (*)    APPearance  CLOUDY (*)    Leukocytes, UA TRACE (*)    Squamous Epithelial / LPF 6-30 (*)    All other components within normal limits  CBC  TROPONIN I  LIPASE, BLOOD  POC URINE PREG, ED  POCT PREGNANCY, URINE   ____________________________________________  EKG  I personally interpreted any EKGs ordered by me or triage Sinus rhythm at 87 bpm no acute ST elevation or depression normal EKG ____________________________________________  RADIOLOGY  I reviewed any imaging ordered by me or triage that were performed during my shift and, if possible, patient and/or family made aware of any abnormal findings. ____________________________________________   PROCEDURES  Procedure(s) performed: None  Procedures  Critical Care performed: None  ____________________________________________   INITIAL IMPRESSION / ASSESSMENT AND PLAN / ED COURSE  Pertinent labs & imaging results that were available during my care of the patient were reviewed by me and considered in my medical decision making (see chart for details).  Patient here with complaints of epigastric burning discomfort relieved apparently by  alcohol worse with spicy food. Workup is very reassuring nothing serious ACS PE dissection myocarditis endocarditis pericarditis pneumonia pneumothorax gallbladder disease etc. We will treat her with anti-acids. She is not pregnant. There are trace leukocytes in her urine but is otherwise unremarkable and I don't think that would be causing her symptoms. We will have her follow up closely with primary care, return precautions and follow-up given and understood. Patient has been diagnosed with reflux before she is not taking her antiacids she did have a GI cocktail and she felt much better. Did counsel her about drinking.    ____________________________________________   FINAL CLINICAL IMPRESSION(S) / ED DIAGNOSES  Final diagnoses:  Abdominal pain      This chart was dictated using voice  recognition software.  Despite best efforts to proofread,  errors can occur which can change meaning.      Jeanmarie Plant, MD 07/30/17 1904    Jeanmarie Plant, MD 07/30/17 [phone]

## 2017-07-30 NOTE — ED Triage Notes (Signed)
Pt presents to ED with CP that began yesterday. Is in middle chest, states also middle back pain. States some nausea. Denies vomiting, dizziness. Denies palpitations. States sweats in sleep. Alert, oriented. Ambulatory. No distress noted.

## 2017-07-30 NOTE — ED Notes (Signed)
Pt given ice water for PO challenge.

## 2018-01-29 ENCOUNTER — Emergency Department
Admission: EM | Admit: 2018-01-29 | Discharge: 2018-01-29 | Disposition: A | Discharge status: Home / Self Care | Payer: Managed Care, Other (non HMO) | Attending: Emergency Medicine | Admitting: Emergency Medicine

## 2018-01-29 ENCOUNTER — Encounter: Payer: Self-pay | Admitting: Emergency Medicine

## 2018-01-29 DIAGNOSIS — Z79899 Other long term (current) drug therapy: Secondary | ICD-10-CM | POA: Diagnosis not present

## 2018-01-29 DIAGNOSIS — F17210 Nicotine dependence, cigarettes, uncomplicated: Secondary | ICD-10-CM | POA: Diagnosis not present

## 2018-01-29 DIAGNOSIS — N390 Urinary tract infection, site not specified: Secondary | ICD-10-CM | POA: Diagnosis not present

## 2018-01-29 DIAGNOSIS — R319 Hematuria, unspecified: Secondary | ICD-10-CM | POA: Insufficient documentation

## 2018-01-29 DIAGNOSIS — R300 Dysuria: Secondary | ICD-10-CM | POA: Diagnosis present

## 2018-01-29 LAB — URINALYSIS, COMPLETE (UACMP) WITH MICROSCOPIC
BILIRUBIN URINE: NEGATIVE
Glucose, UA: NEGATIVE mg/dL
KETONES UR: NEGATIVE mg/dL
Nitrite: NEGATIVE
PH: 6 (ref 5.0–8.0)
Protein, ur: NEGATIVE mg/dL
SPECIFIC GRAVITY, URINE: 1.009 (ref 1.005–1.030)

## 2018-01-29 MED ORDER — PHENAZOPYRIDINE HCL 200 MG PO TABS: 200.0000 mg | ORAL_TABLET | Freq: Three times a day (TID) | ORAL | 0 refills | Status: DC | PRN

## 2018-01-29 MED ORDER — SULFAMETHOXAZOLE-TRIMETHOPRIM 800-160 MG PO TABS: 1.0000 | ORAL_TABLET | Freq: Two times a day (BID) | ORAL | 0 refills | Status: DC

## 2018-01-29 MED ORDER — SULFAMETHOXAZOLE-TRIMETHOPRIM 800-160 MG PO TABS
1.0000 | ORAL_TABLET | Freq: Once | ORAL | Status: AC
  Administered 2018-01-29: 1 via ORAL
  Filled 2018-01-29: qty 1

## 2018-01-29 NOTE — ED Triage Notes (Signed)
Pt with abd cramping for about three days, thinks she might have a UTI.

## 2018-01-29 NOTE — ED Notes (Signed)
See triage note . Presents with some lower abd discomfort and dysuria which started 3 days ago  Afebrile on arrival

## 2018-01-29 NOTE — ED Provider Notes (Signed)
[facility] Emergency Department Provider Note  ____________________________________________   First MD Initiated Contact with Patient 01/29/18 1455     (approximate)  I have reviewed the triage vital signs and the nursing notes.   HISTORY  Chief Complaint Abdominal Pain    HPI Cynthia Davies is a 32 y.o. female who complains of lower abdominal discomfort and dysuria for 3 days.  She states she has had some chills but no known fever.  She denies any vaginal discharge.  She states she thinks she has a urinary tract infection.  She denies any nausea, vomiting, or diarrhea  Past Medical History:  Diagnosis Date  . Anemia   . Medical history non-contributory     Patient Active Problem List   Diagnosis Date Noted  . Irregular contractions 01/07/2016  . History of poor fetal growth 11/11/2015  . History of cesarean delivery, currently pregnant 11/11/2015  . 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 Depression affecting pregnancy in second trimester, antepartum 11/11/2015    Past Surgical History:  Procedure Laterality Date  . CESAREAN SECTION  2008    Prior to Admission medications   Medication Sig Start Date End Date Taking? Authorizing Provider  famotidine (PEPCID) 20 MG tablet Take 1 tablet (20 mg total) by mouth 2 (two) times daily. 07/30/17   Jeanmarie Plant, MD  phenazopyridine (PYRIDIUM) 200 MG tablet Take 1 tablet (200 mg total) by mouth 3 (three) times daily as needed for pain. 01/29/18   Irving Lubbers, Roselyn Bering, PA-C  sulfamethoxazole-trimethoprim (BACTRIM DS,SEPTRA DS) 800-160 MG tablet Take 1 tablet by mouth 2 (two) times daily. 01/29/18   Faythe Ghee, PA-C    Allergies Patient has no known allergies.  No family history on file.  Social History Social History   Tobacco Use  . Smoking status: Current Every Day Smoker    Packs/day: 0.50    Types: Cigarettes  . Smokeless tobacco: Never Used  Substance Use Topics  . Alcohol use: No  . Drug use:  No    Review of Systems  Constitutional: No fever, positive for chills Eyes: No visual changes. ENT: No sore throat. Respiratory: Denies cough Gastrointestinal: Denies vomiting or diarrhea.  Positive suprapubic pain Genitourinary: Positive for dysuria. Musculoskeletal: Negative for back pain. Skin: Negative for rash.    ____________________________________________   PHYSICAL EXAM:  VITAL SIGNS: ED Triage Vitals  Enc Vitals Group     BP 01/29/18 1343 114/76     Pulse Rate 01/29/18 1343 84     Resp 01/29/18 1343 20     Temp 01/29/18 1343 98.1 F (36.7 C)     Temp Source 01/29/18 1343 Oral     SpO2 01/29/18 1343 100 %     Weight 01/29/18 1345 115 lb (52.2 kg)     Height --      Head Circumference --      Peak Flow --      Pain Score 01/29/18 1344 7     Pain Loc --      Pain Edu? --      Excl. in GC? --     Constitutional: Alert and oriented. Well appearing and in no acute distress. Eyes: Conjunctivae are normal.  Head: Atraumatic. Nose: No congestion/rhinnorhea. Mouth/Throat: Mucous membranes are moist.   Cardiovascular: Normal rate, regular rhythm.  Heart sounds are normal Respiratory: Normal respiratory effort.  No retractions, lungs are clear to auscultation Abdomen: Soft, nontender, bowel sounds normal all 4 quads GU: deferred Musculoskeletal: FROM all  extremities, warm and well perfused Neurologic:  Normal speech and language.  Skin:  Skin is warm, dry and intact. No rash noted. Psychiatric: Mood and affect are normal. Speech and behavior are normal.  ____________________________________________   LABS (all labs ordered are listed, but only abnormal results are displayed)  Labs Reviewed  URINALYSIS, COMPLETE (UACMP) WITH MICROSCOPIC - Abnormal; Notable for the following components:      Result Value   Color, Urine YELLOW (*)    APPearance CLOUDY (*)    Hgb urine dipstick SMALL (*)    Leukocytes, UA LARGE (*)    Bacteria, UA RARE (*)    Squamous  Epithelial / LPF 6-30 (*)    Crystals PRESENT (*)    All other components within normal limits   ____________________________________________   ____________________________________________  RADIOLOGY    ____________________________________________   PROCEDURES  Procedure(s) performed: No  Procedures    ____________________________________________   INITIAL IMPRESSION / ASSESSMENT AND PLAN / ED COURSE  Pertinent labs & imaging results that were available during my care of the patient were reviewed by me and considered in my medical decision making (see chart for details).  Patient is 32 year old female presents to the emergency department complaining of UTI symptoms.  On physical exam patient appears well.  She has no abdominal tenderness to palpation.The remainder of the exam is benign   UA is positive for large amount of leuks, wbc  are too numerous to count  Lab results were discussed with the patient.  She still denies vaginal discharge.  Patient was given a prescription for Septra DS 1 twice daily for 7 days.  Pyridium 200 mg 3 times daily as needed for urinary spasms.  She was given a Septra p.o. prior to discharge as she is unsure whether she can get her medication today.  She was instructed to drink plenty of water.  She is to drink cocktail cranberry juice to help increase the acidity of the urine.  If she is worsening, she is to return to the emergency department.  Pyelonephritis symptoms were discussed with the patient.  She states she understands.  She will comply with instructions.  She was discharged in stable condition  As part of my medical decision making, I reviewed the following data within the electronic MEDICAL RECORD NUMBER Nursing notes reviewed and incorporated, Labs reviewed UA positive for large leuks, Notes from prior ED visits and [place] Controlled Substance Database  ____________________________________________   FINAL CLINICAL IMPRESSION(S) / ED  DIAGNOSES  Final diagnoses:  Urinary tract infection with hematuria, site unspecified      NEW MEDICATIONS STARTED DURING THIS VISIT:  New Prescriptions   PHENAZOPYRIDINE (PYRIDIUM) 200 MG TABLET    Take 1 tablet (200 mg total) by mouth 3 (three) times daily as needed for pain.   SULFAMETHOXAZOLE-TRIMETHOPRIM (BACTRIM DS,SEPTRA DS) 800-160 MG TABLET    Take 1 tablet by mouth 2 (two) times daily.     Note:  This document was prepared using Dragon voice recognition software and may include unintentional dictation errors.    Faythe GheeFisher, Lise Pincus W, PA-C 01/29/18 1513    Phineas SemenGoodman, [place], MD 01/29/18 1700

## 2018-01-29 NOTE — Discharge Instructions (Signed)
Follow-up with your regular doctor if you are not better in 3-5 days.  Use medication as prescribed.  Drink plenty of water to flush her system.  He could also drink cocktail cranberry juice.  This will help decrease the amount of bacteria in your urine.  If you become worse please return the emergency department

## 2018-03-30 ENCOUNTER — Emergency Department
Admission: EM | Admit: 2018-03-30 | Discharge: 2018-03-30 | Disposition: A | Discharge status: Home / Self Care | Payer: Managed Care, Other (non HMO) | Attending: Emergency Medicine | Admitting: Emergency Medicine

## 2018-03-30 ENCOUNTER — Other Ambulatory Visit: Payer: Self-pay

## 2018-03-30 DIAGNOSIS — K047 Periapical abscess without sinus: Secondary | ICD-10-CM | POA: Diagnosis not present

## 2018-03-30 DIAGNOSIS — R6884 Jaw pain: Secondary | ICD-10-CM | POA: Diagnosis present

## 2018-03-30 DIAGNOSIS — L03211 Cellulitis of face: Secondary | ICD-10-CM | POA: Diagnosis not present

## 2018-03-30 DIAGNOSIS — F17210 Nicotine dependence, cigarettes, uncomplicated: Secondary | ICD-10-CM | POA: Diagnosis not present

## 2018-03-30 DIAGNOSIS — Z79899 Other long term (current) drug therapy: Secondary | ICD-10-CM | POA: Diagnosis not present

## 2018-03-30 MED ORDER — CEFTRIAXONE SODIUM 1 G IJ SOLR
1.0000 g | Freq: Once | INTRAMUSCULAR | Status: AC
Start: 2018-03-30 — End: 2018-03-30
  Administered 2018-03-30: 1 g via INTRAMUSCULAR
  Filled 2018-03-30: qty 10

## 2018-03-30 MED ORDER — AMOXICILLIN 500 MG PO CAPS: 500.0000 mg | ORAL_CAPSULE | Freq: Three times a day (TID) | ORAL | 0 refills | Status: DC

## 2018-03-30 MED ORDER — HYDROCODONE-ACETAMINOPHEN 5-325 MG PO TABS: 1.0000 | ORAL_TABLET | Freq: Four times a day (QID) | ORAL | 0 refills | Status: DC | PRN

## 2018-03-30 MED ORDER — OXYCODONE-ACETAMINOPHEN 5-325 MG PO TABS
1.0000 | ORAL_TABLET | Freq: Once | ORAL | Status: AC
  Administered 2018-03-30: 1 via ORAL
  Filled 2018-03-30: qty 1

## 2018-03-30 NOTE — Discharge Instructions (Signed)
Follow-up with your regular dentist or return to the emergency department if worsening.  Take medication as prescribed.  If you are unable to be seen by your dentist there is a list of dental clinics in the area attached.  OPTIONS FOR DENTAL FOLLOW UP CARE  [place] [facility] and [facility] - Local Safety Net Dental Clinics [email or web address]   [facility] [phone])  Sharl Ma [phone])  [place] [phone])  [facility] [phone])  [facility] [phone]) This clinic caters to the indigent population and is on a lottery system. Location: [employer] [facility], [employer], [address] [place] Clinic Hours: Wednesdays from 6pm - 9pm, patients seen by a lottery system. For dates, call or go to [email or web address] Services: Cleanings, fillings and simple extractions. Payment Options: DENTAL WORK IS FREE OF CHARGE. Bring proof of income or support. Best way to get seen: Arrive at 5:15 pm - this is a lottery, NOT first come/first serve, so arriving earlier will not increase your chances of being seen.     [facility] [phone] Select option 1 for emergencies   Location: [facility], [employer], [address] [place] Clinic Hours: No walk-ins accepted - call the day before to schedule an appointment. Check in times are 9:30 am and 1:30 pm. Services: Simple extractions, temporary fillings, pulpectomy/pulp debridement, uncomplicated abscess drainage. Payment Options: PAYMENT IS DUE AT THE TIME OF SERVICE.  Fee is usually $100-200, additional surgical procedures (e.g. abscess drainage) may be extra. Cash, checks, Visa/MasterCard accepted.  Can file Medicaid if patient is covered for dental - patient should call case worker to check. No discount for  [facility] patients. Best way to get seen: MUST call the day before and get onto the schedule. Can usually be seen the next 1-2 days. No walk-ins accepted.     [facility] [phone]   Location: [facility], [address], [place] Clinic Hours: M, W, Th, F 8am or 1:30pm, Tues 9a or 1:30 - first come/first served. Services: Simple extractions, temporary fillings, uncomplicated abscess drainage.  You do not need to be an [facility] resident. Payment Options: PAYMENT IS DUE AT THE TIME OF SERVICE. Dental insurance, otherwise sliding scale - bring proof of income or support. Depending on income and treatment needed, cost is usually $50-200. Best way to get seen: Arrive early as it is first come/first served.     [facility] [phone]   Location: [address] Clinic Hours: Mon-Thu 8a-5p Services: Most basic dental services including extractions and fillings. Payment Options: PAYMENT IS DUE AT THE TIME OF SERVICE. Sliding scale, up to 50% off - bring proof if income or support. Medicaid with dental option accepted. Best way to get seen: Call to schedule an appointment, can usually be seen within 2 weeks OR they will try to see walk-ins - show up at 8a or 2p (you may have to wait).     [facility] [phone] [place] RESIDENTS ONLY   Location: [facility], [address], [place], [place] [address] Clinic Hours: By appointment only. Monday - Thursday 8am-5pm, Friday 8am-12pm Services: Cleanings, fillings, extractions. Payment Options: PAYMENT IS DUE AT THE TIME OF SERVICE. Cash, Visa or MasterCard. Sliding scale - $30 minimum per service. Best way to get seen: Come in to office, complete packet and make an appointment -  need proof of income or support monies for each household member and proof of [place] residence. Usually takes  about a month to get in.     [facility] [phone]   Location: [address] [facility] Hours: Walk-in Urgent Care Dental Services are offered Monday-Friday mornings only. The numbers of emergencies accepted daily is limited to the number of providers available. Maximum 15 - Mondays, Wednesdays & Thursdays Maximum 10 - Tuesdays & Fridays Services: You do not need to be a [place] resident to be seen for a dental emergency. Emergencies are defined as pain, swelling, abnormal bleeding, or dental trauma. Walkins will receive x-rays if needed. NOTE: Dental cleaning is not an emergency. Payment Options: PAYMENT IS DUE AT THE TIME OF SERVICE. Minimum co-pay is $40.00 for uninsured patients. Minimum co-pay is $3.00 for Medicaid with dental coverage. Dental Insurance is accepted and must be presented at time of visit. Medicare does not cover dental. Forms of payment: Cash, credit card, checks. Best way to get seen: If not previously registered with the clinic, walk-in dental registration begins at 7:15 am and is on a first come/first serve basis. If previously registered with the clinic, call to make an appointment.     [facility] [phone] [place] RESIDENTS ONLY   Location: [address], [place], [place] Clinic Hours: Mon-Thu 10a-2p Services: Extractions only! Payment Options: FREE (donations accepted) - bring proof of income or support Best way to get seen: Call and schedule an appointment OR come at 8am on the 1st Monday of every month (except for holidays) when it is first come/first served.     [facility] [phone]   Location: [address], [place] Clinic Hours: Friday mornings Services, Payment Options, Best way to get seen: Call for info

## 2018-03-30 NOTE — ED Notes (Signed)
Pt c/o dental pain x 2 days, states she tried to get into her dentist's office on Thursday but was closed.  Pt states she is going to try to get in on Monday, facial swelling noted on the left and pt hold head in hands.

## 2018-03-30 NOTE — ED Triage Notes (Signed)
Pt c/o dental abscess to left upper jaw area - face is swollen and puffy under eye - denies drainage

## 2018-03-30 NOTE — ED Provider Notes (Signed)
[facility] Emergency Department Provider Note  ____________________________________________   First MD Initiated Contact with Patient 03/30/18 1803     (approximate)  I have reviewed the triage vital signs and the nursing notes.   HISTORY  Chief Complaint Dental Pain    HPI Cynthia Davies is a 32 y.o. female presents emergency department complaining of left-sided jaw pain and swelling.  States she has a bad tooth in this area.  She did try to call her dentist on Thursday but they were closed.  She states the pain has become unbearable.  She was given ibuprofen 800 mg by her mother.  This did not help at all.  She states she is having to hold the area to keep it from hurting worse.  She denies any fever or chills at this time.  Past Medical History:  Diagnosis Date  . Anemia   . Medical history non-contributory     Patient Active Problem List   Diagnosis Date Noted  . Irregular contractions 01/07/2016  . History of poor fetal growth 11/11/2015  . History of cesarean delivery, currently pregnant 11/11/2015  . 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 Depression affecting pregnancy in second trimester, antepartum 11/11/2015    Past Surgical History:  Procedure Laterality Date  . CESAREAN SECTION  2008    Prior to Admission medications   Medication Sig Start Date End Date Taking? Authorizing Provider  amoxicillin (AMOXIL) 500 MG capsule Take 1 capsule (500 mg total) by mouth 3 (three) times daily. 03/30/18   Fisher, Roselyn BeringSusan W, PA-C  famotidine (PEPCID) 20 MG tablet Take 1 tablet (20 mg total) by mouth 2 (two) times daily. 07/30/17   Jeanmarie PlantMcShane, James A, MD  HYDROcodone-acetaminophen (NORCO/VICODIN) 5-325 MG tablet Take 1 tablet by mouth every 6 (six) hours as needed for moderate pain. 03/30/18   Fisher, Roselyn BeringSusan W, PA-C  phenazopyridine (PYRIDIUM) 200 MG tablet Take 1 tablet (200 mg total) by mouth 3 (three) times daily as needed for pain. 01/29/18   Fisher, Roselyn BeringSusan W, PA-C    sulfamethoxazole-trimethoprim (BACTRIM DS,SEPTRA DS) 800-160 MG tablet Take 1 tablet by mouth 2 (two) times daily. 01/29/18   Faythe GheeFisher, Susan W, PA-C    Allergies Patient has no known allergies.  No family history on file.  Social History Social History   Tobacco Use  . Smoking status: Current Every Day Smoker    Packs/day: 0.50    Types: Cigarettes  . Smokeless tobacco: Never Used  Substance Use Topics  . Alcohol use: Yes  . Drug use: No    Review of Systems  Constitutional: No fever/chills Eyes: No visual changes. ENT: No sore throat.  Positive for dental pain with facial swelling Respiratory: Denies cough Genitourinary: Negative for dysuria. Musculoskeletal: Negative for back pain. Skin: Negative for rash.    ____________________________________________   PHYSICAL EXAM:  VITAL SIGNS: ED Triage Vitals  Enc Vitals Group     BP 03/30/18 1725 105/79     Pulse Rate 03/30/18 1725 (!) 103     Resp 03/30/18 1725 15     Temp 03/30/18 1725 (!) 97.5 F (36.4 C)     Temp Source 03/30/18 1725 Oral     SpO2 03/30/18 1725 97 %     Weight 03/30/18 1726 114 lb (51.7 kg)     Height 03/30/18 1726 5\' 5"  (1.651 m)     Head Circumference --      Peak Flow --      Pain Score 03/30/18 1726 9  Pain Loc --      Pain Edu? --      Excl. in GC? --     Constitutional: Alert and oriented. Well appearing and in no acute distress.  Patient does appear uncomfortable Eyes: Conjunctivae are normal.  Head: Atraumatic.  Left-sided facial swelling has some redness which extends up to the orbital area Nose: No congestion/rhinnorhea. Mouth/Throat: Mucous membranes are moist.  Positive for poor dentition in the left upper molars. Neck: Is supple, no lymphadenopathy is noted Cardiovascular: Normal rate, regular rhythm.  Heart sounds are normal Respiratory: Normal respiratory effort.  No retractions, lungs clear to auscultation GU: deferred Musculoskeletal: FROM all extremities, warm and  well perfused Neurologic:  Normal speech and language.  Skin:  Skin is warm, dry and intact. No rash noted. Psychiatric: Mood and affect are normal. Speech and behavior are normal.  ____________________________________________   LABS (all labs ordered are listed, but only abnormal results are displayed)  Labs Reviewed - No data to display ____________________________________________   ____________________________________________  RADIOLOGY    ____________________________________________   PROCEDURES  Procedure(s) performed: Rocephin 1 g IM, Percocet 5/325 1 p.o.  Procedures    ____________________________________________   INITIAL IMPRESSION / ASSESSMENT AND PLAN / ED COURSE  Pertinent labs & imaging results that were available during my care of the patient were reviewed by me and considered in my medical decision making (see chart for details).  Patient is a 32 year old female presents emergency department complaining of left-sided facial swelling with tooth pain.  She states the symptoms have worsened overnight.  She is trying to take ibuprofen without any relief.  She did try to contact her dentist on Thursday but they were closed.  On physical exam the patient has a gross amount of swelling on the left side of the face.  It extends from the side of the mouth up to the orbital area of the left eye.  The area is warm to touch.  No lymphadenopathy is noted.  Rocephin 1 g IM, Percocet 5/325 p.o.  Patient was instructed to follow-up with her dentist.  She should continue to take an antibiotic for 1 week, ibuprofen, and she will be given pain medication due to the extent of the swelling.  She was instructed to return to the emergency department if worsening.  She states she understands will comply with our instructions.  Patient was discharged in stable condition     As part of my medical decision making, I reviewed the following data within the electronic medical  record:  Nursing notes reviewed and incorporated, Notes from prior ED visits and [place] Controlled Substance Database  ____________________________________________   FINAL CLINICAL IMPRESSION(S) / ED DIAGNOSES  Final diagnoses:  Dental abscess  Facial cellulitis      NEW MEDICATIONS STARTED DURING THIS VISIT:  Discharge Medication List as of 03/30/2018  6:21 PM    START taking these medications   Details  amoxicillin (AMOXIL) 500 MG capsule Take 1 capsule (500 mg total) by mouth 3 (three) times daily., Starting Sat 03/30/2018, Print    HYDROcodone-acetaminophen (NORCO/VICODIN) 5-325 MG tablet Take 1 tablet by mouth every 6 (six) hours as needed for moderate pain., Starting Sat 03/30/2018, Print         Note:  This document was prepared using Dragon voice recognition software and may include unintentional dictation errors.    Faythe Ghee, PA-C 03/31/18 1008    Sharyn Creamer, MD 03/31/18 [phone]

## 2018-07-02 ENCOUNTER — Encounter: Payer: Self-pay | Admitting: Emergency Medicine

## 2018-07-02 ENCOUNTER — Other Ambulatory Visit: Payer: Self-pay

## 2018-07-02 ENCOUNTER — Emergency Department
Admission: EM | Admit: 2018-07-02 | Discharge: 2018-07-02 | Disposition: A | Discharge status: Home / Self Care | Payer: Managed Care, Other (non HMO) | Attending: Emergency Medicine | Admitting: Emergency Medicine

## 2018-07-02 DIAGNOSIS — F17210 Nicotine dependence, cigarettes, uncomplicated: Secondary | ICD-10-CM | POA: Diagnosis not present

## 2018-07-02 DIAGNOSIS — H9202 Otalgia, left ear: Secondary | ICD-10-CM | POA: Diagnosis present

## 2018-07-02 DIAGNOSIS — H60502 Unspecified acute noninfective otitis externa, left ear: Secondary | ICD-10-CM | POA: Insufficient documentation

## 2018-07-02 MED ORDER — NEOMYCIN-POLYMYXIN-HC 3.5-10000-1 OT SOLN: 3.0000 [drp] | Freq: Four times a day (QID) | OTIC | 0 refills | Status: AC

## 2018-07-02 NOTE — ED Notes (Signed)
Pt reports pain to the left ear and headache. Pt says she could not get out of bed due to the pain. Pt states she took alieve this am. Pt reports headache as well. Pt denies using any drops to ears. Pt says her eyes hurt and she feels pressure.

## 2018-07-02 NOTE — ED Triage Notes (Signed)
PT arrived with concerns of left sided ear pain and headache. PT reports no relief with aleve. PT appears in NAD

## 2018-07-02 NOTE — Discharge Instructions (Signed)
Follow-up with your primary care provider or Dr. Jenne CampusMcQueen if any continued problems with your ear.  Begin using eardrops tonight.  You may continue using Aleve.  The adult strength for severe pain is 2 tablets twice a day with food.

## 2018-07-02 NOTE — ED Provider Notes (Signed)
[facility] Emergency Department Provider Note  ____________________________________________   First MD Initiated Contact with Patient 07/02/18 1606     (approximate)  I have reviewed the triage vital signs and the nursing notes.   HISTORY  Chief Complaint Headache and Otalgia   HPI Cynthia Davies is a 31 y.o. female 0 complaining of left ear pain along with frontal headache.  Patient states that she took Aleve without any relief of her ear pain but it did seem to help with her headache this morning.  Patient denies any upper respiratory symptoms, nausea, fever or chills.  She denies any recent swimming.  She states that it feels as if there is drainage from her left ear at times.  She denies any decrease in hearing.  She rates her pain as an 8 out of 10.   Past Medical History:  Diagnosis Date  . Anemia   . Medical history non-contributory     Patient Active Problem List   Diagnosis Date Noted  . Irregular contractions 01/07/2016  . History of poor fetal growth 11/11/2015  . History of cesarean delivery, currently pregnant 11/11/2015  . 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 Depression affecting pregnancy in second trimester, antepartum 11/11/2015    Past Surgical History:  Procedure Laterality Date  . CESAREAN SECTION  2008    Prior to Admission medications   Medication Sig Start Date End Date Taking? Authorizing Provider  famotidine (PEPCID) 20 MG tablet Take 1 tablet (20 mg total) by mouth 2 (two) times daily. 07/30/17   Jeanmarie Plant, MD  neomycin-polymyxin-hydrocortisone (CORTISPORIN) OTIC solution Place 3 drops into the left ear 4 (four) times daily for 10 days. 07/02/18 07/12/18  Tommi Rumps, PA-C    Allergies Patient has no known allergies.  No family history on file.  Social History Social History   Tobacco Use  . Smoking status: Current Every Day Smoker    Packs/day: 0.50    Types: Cigarettes  . Smokeless tobacco: Never Used    Substance Use Topics  . Alcohol use: Yes  . Drug use: No    Review of Systems Constitutional: No fever/chills Eyes: No visual changes. ENT: Positive left ear pain. Cardiovascular: Denies chest pain. Respiratory: Denies shortness of breath. Musculoskeletal: Negative for muscle aches. Neurological: Negative for headaches. ___________________________________________   PHYSICAL EXAM:  VITAL SIGNS: ED Triage Vitals  Enc Vitals Group     BP 07/02/18 1515 109/71     Pulse Rate 07/02/18 1515 64     Resp 07/02/18 1515 18     Temp 07/02/18 1515 98.6 F (37 C)     Temp Source 07/02/18 1515 Oral     SpO2 07/02/18 1515 99 %     Weight 07/02/18 1515 116 lb (52.6 kg)     Height 07/02/18 1515 5\' 5"  (1.651 m)     Head Circumference --      Peak Flow --      Pain Score 07/02/18 1518 8     Pain Loc --      Pain Edu? --      Excl. in GC? --    Constitutional: Alert and oriented. Well appearing and in no acute distress. Eyes: Conjunctivae are normal. PERRL. EOMI. Head: Atraumatic. Nose: No congestion/rhinnorhea.  Right EAC is obstructed with cerumen.  Left EAC is partially obstructed with cerumen with some minimal exudate noted.  TM is dull but no erythema or injection is seen.  Moderate tenderness on left tragus. Mouth/Throat:  Mucous membranes are moist.  Oropharynx non-erythematous. Neck: No stridor.   Hematological/Lymphatic/Immunilogical: Minimal left cervical lymphadenopathy. Cardiovascular: Normal rate, regular rhythm. Grossly normal heart sounds.  Good peripheral circulation. Respiratory: Normal respiratory effort.  No retractions. Lungs CTAB. Neurologic:  Normal speech and language. No gross focal neurologic deficits are appreciated.  Skin:  Skin is warm, dry and intact.  Psychiatric: Mood and affect are normal. Speech and behavior are normal.  ____________________________________________   LABS (all labs ordered are listed, but only abnormal results are displayed)  Labs  Reviewed - No data to display  PROCEDURES  Procedure(s) performed: None  Procedures  Critical Care performed: No  ____________________________________________   INITIAL IMPRESSION / ASSESSMENT AND PLAN / ED COURSE  As part of my medical decision making, I reviewed the following data within the electronic MEDICAL RECORD NUMBER Notes from prior ED visits and [place] Controlled Substance Database  Patient is here with complaint of left ear pain and frontal headache.  Findings are consistent with an acute otitis externa.  Patient's prescription for Cortisporin otic suspension [facility] on [address].  Patient will continue taking Aleve for her headache.  She is encouraged to follow-up with Dr. Jenne CampusMcQueen at [facility] if any continued problems or worsening of her symptoms.  ____________________________________________   FINAL CLINICAL IMPRESSION(S) / ED DIAGNOSES  Final diagnoses:  Acute otitis externa of left ear, unspecified type     ED Discharge Orders        Ordered    neomycin-polymyxin-hydrocortisone (CORTISPORIN) OTIC solution  4 times daily     07/02/18 1703       Note:  This document was prepared using Dragon voice recognition software and may include unintentional dictation errors.    Tommi RumpsSummers, Nekayla Heider L, PA-C 07/02/18 1722    Sharman CheekStafford, Phillip, MD 07/08/18 0020

## 2018-11-22 LAB — HM PAP SMEAR: HM Pap smear: NEGATIVE

## 2019-06-18 DIAGNOSIS — Z62810 Personal history of physical and sexual abuse in childhood: Secondary | ICD-10-CM

## 2019-06-19 ENCOUNTER — Other Ambulatory Visit: Payer: Self-pay

## 2019-06-19 ENCOUNTER — Ambulatory Visit (LOCAL_COMMUNITY_HEALTH_CENTER): Payer: Self-pay

## 2019-06-19 VITALS — BP 97/67 | Ht 63.25 in | Wt 119.0 lb

## 2019-06-19 DIAGNOSIS — Z3009 Encounter for other general counseling and advice on contraception: Secondary | ICD-10-CM

## 2019-06-19 DIAGNOSIS — Z3200 Encounter for pregnancy test, result unknown: Secondary | ICD-10-CM | POA: Insufficient documentation

## 2019-06-19 DIAGNOSIS — Z3042 Encounter for surveillance of injectable contraceptive: Secondary | ICD-10-CM | POA: Insufficient documentation

## 2019-06-19 DIAGNOSIS — Z30013 Encounter for initial prescription of injectable contraceptive: Secondary | ICD-10-CM

## 2019-06-19 LAB — PREGNANCY, URINE: Preg Test, Ur: NEGATIVE

## 2019-06-19 MED ORDER — MEDROXYPROGESTERONE ACETATE 150 MG/ML IM SUSP
150.0000 mg | Freq: Once | INTRAMUSCULAR | Status: AC
  Administered 2019-06-19: 150 mg via INTRAMUSCULAR

## 2019-06-19 NOTE — Progress Notes (Signed)
Last physical with [facility] was 11/22/2018. Last depo 11/22/2018 at [facility]; pt is 29.6 weeks post depo today. Consulted with provider about pt request and details. Per verbal order by Jerline Pain, FNP, sent pt for UPT; per verbal, it UPT is negative, give Depo 150 mg IM today. Pt to use condoms as back-up method x 1 week with depo restart. UPT negative, depo given, provider orders completed.

## 2019-09-11 ENCOUNTER — Other Ambulatory Visit: Payer: Self-pay

## 2019-09-11 ENCOUNTER — Ambulatory Visit (LOCAL_COMMUNITY_HEALTH_CENTER): Payer: Self-pay

## 2019-09-11 VITALS — BP 116/78 | Ht 63.25 in | Wt 119.0 lb

## 2019-09-11 DIAGNOSIS — Z30013 Encounter for initial prescription of injectable contraceptive: Secondary | ICD-10-CM

## 2019-09-11 DIAGNOSIS — Z3009 Encounter for other general counseling and advice on contraception: Secondary | ICD-10-CM

## 2019-09-11 MED ORDER — MULTI-VITAMIN/MINERALS PO TABS: 1.0000 | ORAL_TABLET | Freq: Every day | ORAL | 0 refills | Status: DC

## 2019-09-11 MED ORDER — MEDROXYPROGESTERONE ACETATE 150 MG/ML IM SUSP
150.0000 mg | Freq: Once | INTRAMUSCULAR | Status: AC
  Administered 2019-09-11: 150 mg via INTRAMUSCULAR

## 2019-09-11 NOTE — Progress Notes (Signed)
Folic acid counseling completed and MVI dispensed. Depo administered without difficulty per 11/22/2018 written order of Antoine Primas PA. Client tolerated injection without complaint. Rich Number, RN

## 2019-11-27 ENCOUNTER — Other Ambulatory Visit: Payer: Self-pay

## 2019-11-27 ENCOUNTER — Ambulatory Visit: Payer: Self-pay

## 2019-11-27 VITALS — BP 106/75 | Ht 63.25 in | Wt 128.0 lb

## 2019-11-27 DIAGNOSIS — Z3009 Encounter for other general counseling and advice on contraception: Secondary | ICD-10-CM

## 2019-11-27 DIAGNOSIS — Z3042 Encounter for surveillance of injectable contraceptive: Secondary | ICD-10-CM

## 2019-11-27 DIAGNOSIS — Z30013 Encounter for initial prescription of injectable contraceptive: Secondary | ICD-10-CM

## 2019-11-27 MED ORDER — MEDROXYPROGESTERONE ACETATE 150 MG/ML IM SUSP
150.0000 mg | Freq: Once | INTRAMUSCULAR | Status: AC
  Administered 2019-11-27: 17:00:00 150 mg via INTRAMUSCULAR

## 2019-11-27 NOTE — Progress Notes (Signed)
Last physical at [facility] 11/22/2018, 1 year depo order by Antoine Primas, PA. Last depo at [facility] 09/11/2019; 11.0 weeks post depo today. Administered DMPA 150 mg IM today per Dr. Lauretta Chester standing order for one time depo when physical is due.

## 2019-12-02 ENCOUNTER — Other Ambulatory Visit: Payer: Self-pay

## 2019-12-02 ENCOUNTER — Emergency Department: Payer: Managed Care, Other (non HMO)

## 2019-12-02 ENCOUNTER — Emergency Department
Admission: EM | Admit: 2019-12-02 | Discharge: 2019-12-02 | Disposition: A | Discharge status: Home / Self Care | Payer: Managed Care, Other (non HMO) | Attending: Student in an Organized Health Care Education/Training Program | Admitting: Student in an Organized Health Care Education/Training Program

## 2019-12-02 ENCOUNTER — Encounter: Payer: Self-pay | Admitting: *Deleted

## 2019-12-02 DIAGNOSIS — F17210 Nicotine dependence, cigarettes, uncomplicated: Secondary | ICD-10-CM | POA: Insufficient documentation

## 2019-12-02 DIAGNOSIS — R071 Chest pain on breathing: Secondary | ICD-10-CM | POA: Insufficient documentation

## 2019-12-02 DIAGNOSIS — Z20828 Contact with and (suspected) exposure to other viral communicable diseases: Secondary | ICD-10-CM | POA: Insufficient documentation

## 2019-12-02 DIAGNOSIS — R0789 Other chest pain: Secondary | ICD-10-CM | POA: Insufficient documentation

## 2019-12-02 LAB — CBC
HCT: 38 % (ref 36.0–46.0)
Hemoglobin: 13 g/dL (ref 12.0–15.0)
MCH: 29.3 pg (ref 26.0–34.0)
MCHC: 34.2 g/dL (ref 30.0–36.0)
MCV: 85.8 fL (ref 80.0–100.0)
Platelets: 186 10*3/uL (ref 150–400)
RBC: 4.43 MIL/uL (ref 3.87–5.11)
RDW: 11.9 % (ref 11.5–15.5)
WBC: 5.1 10*3/uL (ref 4.0–10.5)
nRBC: 0 % (ref 0.0–0.2)

## 2019-12-02 LAB — BASIC METABOLIC PANEL
Anion gap: 11 (ref 5–15)
BUN: 12 mg/dL (ref 6–20)
CO2: 22 mmol/L (ref 22–32)
Calcium: 9.2 mg/dL (ref 8.9–10.3)
Chloride: 104 mmol/L (ref 98–111)
Creatinine, Ser: 0.92 mg/dL (ref 0.44–1.00)
GFR calc Af Amer: >60 mL/min (ref >60)
GFR calc non Af Amer: >60 mL/min (ref >60)
Glucose, Bld: 95 mg/dL (ref 70–99)
Potassium: 4 mmol/L (ref 3.5–5.1)
Sodium: 137 mmol/L (ref 135–145)

## 2019-12-02 LAB — URINALYSIS, COMPLETE (UACMP) WITH MICROSCOPIC
Bacteria, UA: NONE SEEN
Bilirubin Urine: NEGATIVE
Glucose, UA: NEGATIVE mg/dL
Hgb urine dipstick: NEGATIVE
Ketones, ur: NEGATIVE mg/dL
Leukocytes,Ua: NEGATIVE
Nitrite: NEGATIVE
Protein, ur: NEGATIVE mg/dL
Specific Gravity, Urine: 1.01 (ref 1.005–1.030)
pH: 7 (ref 5.0–8.0)

## 2019-12-02 LAB — HEPATIC FUNCTION PANEL
ALT: 23 U/L (ref 0–44)
AST: 26 U/L (ref 15–41)
Albumin: 4.4 g/dL (ref 3.5–5.0)
Alkaline Phosphatase: 52 U/L (ref 38–126)
Bilirubin, Direct: 0.1 mg/dL (ref 0.0–0.2)
Indirect Bilirubin: 0.7 mg/dL (ref 0.3–0.9)
Total Bilirubin: 0.8 mg/dL (ref 0.3–1.2)
Total Protein: 7.9 g/dL (ref 6.5–8.1)

## 2019-12-02 LAB — POCT PREGNANCY, URINE: Preg Test, Ur: NEGATIVE

## 2019-12-02 LAB — TROPONIN I (HIGH SENSITIVITY)
Troponin I (High Sensitivity): <2 ng/L (ref <18)
Troponin I (High Sensitivity): <2 ng/L (ref <18)

## 2019-12-02 LAB — SARS CORONAVIRUS 2 (TAT 6-24 HRS): SARS Coronavirus 2: NEGATIVE

## 2019-12-02 LAB — FIBRIN DERIVATIVES D-DIMER (ARMC ONLY): Fibrin derivatives D-dimer (ARMC): 600.66 ng/mL (FEU) — ABNORMAL HIGH (ref 0.00–499.00)

## 2019-12-02 MED ORDER — SODIUM CHLORIDE 0.9 % IV BOLUS
500.0000 mL | Freq: Once | INTRAVENOUS | Status: AC
  Administered 2019-12-02: 500 mL via INTRAVENOUS

## 2019-12-02 MED ORDER — IOHEXOL 350 MG/ML SOLN
75.0000 mL | Freq: Once | INTRAVENOUS | Status: AC | PRN
  Administered 2019-12-02: 75 mL via INTRAVENOUS
  Filled 2019-12-02: qty 75

## 2019-12-02 MED ORDER — FAMOTIDINE 20 MG PO TABS: 20.0000 mg | ORAL_TABLET | Freq: Two times a day (BID) | ORAL | 0 refills | Status: DC

## 2019-12-02 MED ORDER — SODIUM CHLORIDE 0.9% FLUSH
3.0000 mL | Freq: Once | INTRAVENOUS | Status: AC
  Administered 2019-12-02: 3 mL via INTRAVENOUS

## 2019-12-02 MED ORDER — HYDROCODONE-ACETAMINOPHEN 5-325 MG PO TABS
1.0000 | ORAL_TABLET | Freq: Once | ORAL | Status: AC
  Administered 2019-12-02: 1 via ORAL
  Filled 2019-12-02: qty 1
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
## 2019-12-02 NOTE — ED Triage Notes (Signed)
Pt has upper abd pain radiating into the chest area.  Pt took 2 aleves without relief. No n/v/d.  No back pain.  cig smoker.  No cough.  Pt alert. Speech clear.

## 2020-02-24 ENCOUNTER — Ambulatory Visit: Payer: Managed Care, Other (non HMO)

## 2020-03-08 ENCOUNTER — Encounter: Payer: Self-pay | Admitting: Family Medicine

## 2020-03-08 ENCOUNTER — Other Ambulatory Visit: Payer: Self-pay

## 2020-03-08 ENCOUNTER — Ambulatory Visit (LOCAL_COMMUNITY_HEALTH_CENTER): Payer: Self-pay | Admitting: Family Medicine

## 2020-03-08 VITALS — BP 106/74 | Ht 65.0 in | Wt 126.8 lb

## 2020-03-08 DIAGNOSIS — Z3009 Encounter for other general counseling and advice on contraception: Secondary | ICD-10-CM

## 2020-03-08 MED ORDER — MEDROXYPROGESTERONE ACETATE 150 MG/ML IM SUSP
150.0000 mg | INTRAMUSCULAR | Status: AC
  Administered 2020-03-08 – 2021-01-07 (×4): 150 mg via INTRAMUSCULAR

## 2020-03-08 NOTE — Progress Notes (Signed)
In for Depo; 14+ wks. Since last injection Sharlette Dense, RN
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
## 2020-04-15 ENCOUNTER — Encounter: Payer: Self-pay | Admitting: Emergency Medicine

## 2020-04-15 ENCOUNTER — Other Ambulatory Visit: Payer: Self-pay

## 2020-04-15 ENCOUNTER — Emergency Department
Admission: EM | Admit: 2020-04-15 | Discharge: 2020-04-15 | Disposition: A | Discharge status: Home / Self Care | Payer: Managed Care, Other (non HMO) | Attending: Emergency Medicine | Admitting: Emergency Medicine

## 2020-04-15 DIAGNOSIS — F17210 Nicotine dependence, cigarettes, uncomplicated: Secondary | ICD-10-CM | POA: Insufficient documentation

## 2020-04-15 DIAGNOSIS — J020 Streptococcal pharyngitis: Secondary | ICD-10-CM | POA: Insufficient documentation

## 2020-04-15 DIAGNOSIS — Z79899 Other long term (current) drug therapy: Secondary | ICD-10-CM | POA: Insufficient documentation

## 2020-04-15 LAB — GROUP A STREP BY PCR: Group A Strep by PCR: DETECTED — AB

## 2020-04-15 MED ORDER — AMOXICILLIN 400 MG/5ML PO SUSR: 500.0000 mg | Freq: Three times a day (TID) | ORAL | 0 refills | Status: AC

## 2020-04-15 MED ORDER — ACETAMINOPHEN-CODEINE 120-12 MG/5ML PO SOLN: 5.0000 mL | Freq: Four times a day (QID) | ORAL | 0 refills | Status: AC | PRN

## 2020-04-15 MED ORDER — ACETAMINOPHEN 160 MG/5ML PO SOLN: 650.0000 mg | Freq: Once | ORAL | Status: DC

## 2020-04-15 MED ORDER — LIDOCAINE VISCOUS HCL 2 % MT SOLN
15.0000 mL | Freq: Once | OROMUCOSAL | Status: AC
  Administered 2020-04-15: 15 mL via OROMUCOSAL
  Filled 2020-04-15: qty 15

## 2020-04-15 MED ORDER — ACETAMINOPHEN-CODEINE 120-12 MG/5ML PO SOLN
5.0000 mL | Freq: Once | ORAL | Status: AC
  Administered 2020-04-15: 5 mL via ORAL
  Filled 2020-04-15: qty 5

## 2020-04-15 MED ORDER — AMOXICILLIN 250 MG/5ML PO SUSR
500.0000 mg | Freq: Once | ORAL | Status: AC
  Administered 2020-04-15: 500 mg via ORAL
  Filled 2020-04-15: qty 10

## 2020-04-15 NOTE — ED Triage Notes (Signed)
Pt reports last week had some cough and congestion and yesterday started with a sore throat.
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
## 2020-04-15 NOTE — ED Notes (Signed)
See triage note  Presents with some URI sx's    States had some cough and congestion for about a week   Then developed sore throat  Low grade fever noted on arrival

## 2020-05-31 ENCOUNTER — Ambulatory Visit: Payer: Self-pay

## 2020-06-02 ENCOUNTER — Ambulatory Visit: Payer: Self-pay

## 2020-07-05 ENCOUNTER — Ambulatory Visit (LOCAL_COMMUNITY_HEALTH_CENTER): Payer: Self-pay | Admitting: Advanced Practice Midwife

## 2020-07-05 ENCOUNTER — Other Ambulatory Visit: Payer: Self-pay

## 2020-07-05 VITALS — BP 107/71 | Ht 64.0 in | Wt 124.0 lb

## 2020-07-05 DIAGNOSIS — R87619 Unspecified abnormal cytological findings in specimens from cervix uteri: Secondary | ICD-10-CM | POA: Insufficient documentation

## 2020-07-05 DIAGNOSIS — Z30013 Encounter for initial prescription of injectable contraceptive: Secondary | ICD-10-CM

## 2020-07-05 DIAGNOSIS — Z3200 Encounter for pregnancy test, result unknown: Secondary | ICD-10-CM

## 2020-07-05 DIAGNOSIS — F17200 Nicotine dependence, unspecified, uncomplicated: Secondary | ICD-10-CM | POA: Insufficient documentation

## 2020-07-05 DIAGNOSIS — Z3042 Encounter for surveillance of injectable contraceptive: Secondary | ICD-10-CM

## 2020-07-05 DIAGNOSIS — Z3009 Encounter for other general counseling and advice on contraception: Secondary | ICD-10-CM

## 2020-07-05 DIAGNOSIS — R87610 Atypical squamous cells of undetermined significance on cytologic smear of cervix (ASC-US): Secondary | ICD-10-CM

## 2020-07-05 LAB — PREGNANCY, URINE: Preg Test, Ur: NEGATIVE

## 2020-07-05 NOTE — Progress Notes (Signed)
DMPA 150 mg IM administered per Arnetha Courser, CNM order. Provider orders completed.

## 2020-07-05 NOTE — Progress Notes (Signed)
Contraception/Family Planning VISIT ENCOUNTER NOTE  Subjective:   Cynthia Davies is a 34 y.o.SBF G4P2 smoker female here for reproductive life counseling.  Desires DMPA restart.  Reports she does not want a pregnancy in the next year. Denies abnormal vaginal bleeding, discharge, pelvic pain, problems with intercourse or other gynecologic concerns.  Last DMPA 03/08/20.  Last sex 06/13/20 without condom; with current partner x 6 years.  No LMP.  Smoking 1/2 ppd   Gynecologic History No LMP recorded. Patient has had an injection. Contraception: nothing  Health Maintenance Due  Topic Date Due  . Hepatitis C Screening  Never done  . COVID-19 Vaccine (1) Never done  . TETANUS/TDAP  Never done     The following portions of the patient's history were reviewed and updated as appropriate: allergies, current medications, past family history, past medical history, past social history, past surgical history and problem list.  Review of Systems Pertinent items are noted in HPI.   Objective:  BP 107/71   Ht 5\' 4"  (1.626 m)   Wt 124 lb (56.2 kg)   Breastfeeding No   BMI 21.28 kg/m  Gen: well appearing, NAD HEENT: no scleral icterus CV: RR Lung: Normal WOB Ext: warm well perfused     Assessment and Plan:   Contraception counseling: Reviewed all forms of birth control options in the tiered based approach. available including abstinence; over the counter/barrier methods; hormonal contraceptive medication including pill, patch, ring, injection,contraceptive implant, ECP; hormonal and nonhormonal IUDs; permanent sterilization options including vasectomy and the various tubal sterilization modalities. Risks, benefits, and typical effectiveness rates were reviewed.  Questions were answered.  Written information was also given to the patient to review.  Patient desires DMPA, this was prescribed for patient. She will follow up in 11-13 wks for surveillance.  She was told to call with any further  questions, or with any concerns about this method of contraception.  Emphasized use of condoms 100% of the time for STI prevention.  Patient was not offered ECP because not eligible.  1. Possible pregnancy, not yet confirmed PT neg today.  Last sex 06/13/20 without condom - Pregnancy, urine  2. Atypical squamous cells of undetermined significance on cytologic smear of cervix (ASC-US) Needs cotest 11/2021  3. Family planning May have DMPA 150 mg IM per last order by June, PA on 03/08/20    Please refer to After Visit Summary for other counseling recommendations.   Return in about 11 weeks (around 09/20/2020) for Depo.  11/20/2020, CNM [facility] DEPARTMENT

## 2020-07-05 NOTE — Progress Notes (Signed)
Pt is 17.0 weeks post depo today; pt desires to restart depo. Last sex was 2-3 weeks ago, without condom.

## 2020-09-22 ENCOUNTER — Ambulatory Visit (LOCAL_COMMUNITY_HEALTH_CENTER): Payer: Self-pay

## 2020-09-22 ENCOUNTER — Other Ambulatory Visit: Payer: Self-pay

## 2020-09-22 VITALS — BP 90/66 | Ht 64.0 in | Wt 121.5 lb

## 2020-09-22 DIAGNOSIS — Z3009 Encounter for other general counseling and advice on contraception: Secondary | ICD-10-CM

## 2020-09-22 DIAGNOSIS — Z3042 Encounter for surveillance of injectable contraceptive: Secondary | ICD-10-CM

## 2020-09-22 NOTE — Progress Notes (Signed)
11 weeks 2 days post depo. DMPA 150mg  IM given Left UOQ per order , PA-C dated 03/08/2020. Tolerated well. Depo consent signed today. Next depo due 12/08/20. Reminder card given. 12/10/20, RN

## 2020-11-24 ENCOUNTER — Other Ambulatory Visit: Payer: Self-pay

## 2020-11-24 ENCOUNTER — Ambulatory Visit (LOCAL_COMMUNITY_HEALTH_CENTER): Payer: Self-pay

## 2020-11-24 DIAGNOSIS — Z3009 Encounter for other general counseling and advice on contraception: Secondary | ICD-10-CM

## 2020-11-24 NOTE — Progress Notes (Addendum)
Pt here for depo. 9 weeks post depo. Last depo given 09/22/2020. Voices no problems today. RN counseled pt that depo is too early to give today and is due 12/08/2020 and should be given between 11-13 weeks from last depo.Pt reports she got dates mixed up and plans to reschedule appt at appropriate time. Jerel Shepherd, RN

## 2020-12-21 ENCOUNTER — Ambulatory Visit: Payer: Self-pay

## 2020-12-23 ENCOUNTER — Emergency Department: Payer: Self-pay

## 2020-12-23 ENCOUNTER — Other Ambulatory Visit: Payer: Self-pay

## 2020-12-23 ENCOUNTER — Emergency Department
Admission: EM | Admit: 2020-12-23 | Discharge: 2020-12-23 | Disposition: A | Discharge status: Home / Self Care | Payer: Self-pay | Attending: Emergency Medicine | Admitting: Emergency Medicine

## 2020-12-23 DIAGNOSIS — Z20822 Contact with and (suspected) exposure to covid-19: Secondary | ICD-10-CM | POA: Insufficient documentation

## 2020-12-23 DIAGNOSIS — N390 Urinary tract infection, site not specified: Secondary | ICD-10-CM | POA: Insufficient documentation

## 2020-12-23 DIAGNOSIS — F17210 Nicotine dependence, cigarettes, uncomplicated: Secondary | ICD-10-CM | POA: Insufficient documentation

## 2020-12-23 DIAGNOSIS — R1011 Right upper quadrant pain: Secondary | ICD-10-CM

## 2020-12-23 LAB — POC URINE PREG, ED: Preg Test, Ur: NEGATIVE

## 2020-12-23 LAB — CBC WITH DIFFERENTIAL/PLATELET
Abs Immature Granulocytes: 0.07 10*3/uL (ref 0.00–0.07)
Basophils Absolute: 0 10*3/uL (ref 0.0–0.1)
Basophils Relative: 0 %
Eosinophils Absolute: 0 10*3/uL (ref 0.0–0.5)
Eosinophils Relative: 0 %
HCT: 41.6 % (ref 36.0–46.0)
Hemoglobin: 13.6 g/dL (ref 12.0–15.0)
Immature Granulocytes: 1 %
Lymphocytes Relative: 8 %
Lymphs Abs: 1 10*3/uL (ref 0.7–4.0)
MCH: 29.6 pg (ref 26.0–34.0)
MCHC: 32.7 g/dL (ref 30.0–36.0)
MCV: 90.6 fL (ref 80.0–100.0)
Monocytes Absolute: 0.3 10*3/uL (ref 0.1–1.0)
Monocytes Relative: 2 %
Neutro Abs: 11.4 10*3/uL — ABNORMAL HIGH (ref 1.7–7.7)
Neutrophils Relative %: 89 %
Platelets: 164 10*3/uL (ref 150–400)
RBC: 4.59 MIL/uL (ref 3.87–5.11)
RDW: 12.1 % (ref 11.5–15.5)
WBC: 12.8 10*3/uL — ABNORMAL HIGH (ref 4.0–10.5)
nRBC: 0 % (ref 0.0–0.2)

## 2020-12-23 LAB — COMPREHENSIVE METABOLIC PANEL
ALT: 17 U/L (ref 0–44)
AST: 21 U/L (ref 15–41)
Albumin: 4.2 g/dL (ref 3.5–5.0)
Alkaline Phosphatase: 53 U/L (ref 38–126)
Anion gap: 13 (ref 5–15)
BUN: 15 mg/dL (ref 6–20)
CO2: 22 mmol/L (ref 22–32)
Calcium: 9.4 mg/dL (ref 8.9–10.3)
Chloride: 101 mmol/L (ref 98–111)
Creatinine, Ser: 1.16 mg/dL — ABNORMAL HIGH (ref 0.44–1.00)
GFR, Estimated: >60 mL/min (ref >60)
Glucose, Bld: 153 mg/dL — ABNORMAL HIGH (ref 70–99)
Potassium: 3.9 mmol/L (ref 3.5–5.1)
Sodium: 136 mmol/L (ref 135–145)
Total Bilirubin: 1.4 mg/dL — ABNORMAL HIGH (ref 0.3–1.2)
Total Protein: 8.2 g/dL — ABNORMAL HIGH (ref 6.5–8.1)

## 2020-12-23 LAB — URINALYSIS, COMPLETE (UACMP) WITH MICROSCOPIC
Bilirubin Urine: NEGATIVE
Glucose, UA: NEGATIVE mg/dL
Ketones, ur: 5 mg/dL — AB
Nitrite: POSITIVE — AB
Protein, ur: 100 mg/dL — AB
Specific Gravity, Urine: 1.017 (ref 1.005–1.030)
WBC, UA: >50 WBC/hpf — ABNORMAL HIGH (ref 0–5)
pH: 6 (ref 5.0–8.0)

## 2020-12-23 LAB — LACTIC ACID, PLASMA: Lactic Acid, Venous: 1.5 mmol/L (ref 0.5–1.9)

## 2020-12-23 MED ORDER — SULFAMETHOXAZOLE-TRIMETHOPRIM 800-160 MG PO TABS: 1.0000 | ORAL_TABLET | Freq: Two times a day (BID) | ORAL | 0 refills | Status: DC

## 2020-12-23 MED ORDER — ACETAMINOPHEN 500 MG PO TABS
500.0000 mg | ORAL_TABLET | Freq: Once | ORAL | Status: AC
  Administered 2020-12-23: 500 mg via ORAL

## 2020-12-23 MED ORDER — HYDROCODONE-ACETAMINOPHEN 5-325 MG PO TABS: 1.0000 | ORAL_TABLET | Freq: Four times a day (QID) | ORAL | 0 refills | Status: DC | PRN

## 2020-12-23 MED ORDER — ACETAMINOPHEN 500 MG PO TABS
1000.0000 mg | ORAL_TABLET | Freq: Once | ORAL | Status: DC
  Filled 2020-12-23: qty 2

## 2020-12-23 MED ORDER — SODIUM CHLORIDE 0.9 % IV BOLUS
1000.0000 mL | Freq: Once | INTRAVENOUS | Status: AC
  Administered 2020-12-23: 1000 mL via INTRAVENOUS

## 2020-12-23 MED ORDER — KETOROLAC TROMETHAMINE 30 MG/ML IJ SOLN: 30.0000 mg | Freq: Once | INTRAMUSCULAR | Status: DC

## 2020-12-23 MED ORDER — SODIUM CHLORIDE 0.9 % IV SOLN
1.0000 g | Freq: Once | INTRAVENOUS | Status: AC
  Administered 2020-12-23: 1 g via INTRAVENOUS
  Filled 2020-12-23: qty 10

## 2020-12-23 MED ORDER — HYDROCODONE-ACETAMINOPHEN 5-325 MG PO TABS
1.0000 | ORAL_TABLET | Freq: Once | ORAL | Status: AC
  Administered 2020-12-23: 1 via ORAL
  Filled 2020-12-23: qty 1

## 2020-12-23 NOTE — Discharge Instructions (Signed)
Follow-up with your primary care provider or return to the emergency department if any severe worsening of your symptoms.  Begin taking antibiotics as directed until completely finished in 10 days.  Increase fluids.  Ibuprofen if needed for body aches or fever.  Also Norco which contains hydrocodone and Tylenol was sent to your pharmacy if needed for moderate pain.  While you are in the emergency department a COVID test was ordered.  The results of this test we will be seen in MyChart in approximately 6 to 24 hours.  If this test is positive you will need to quarantine for approximately 10 days.

## 2020-12-23 NOTE — ED Notes (Signed)
Pt verbalized understanding of d/c instructions at this time. Pt denies further questions at this time. Pt ambulatory to lobby at this time, steady gait noted, NAD noted.

## 2020-12-23 NOTE — ED Triage Notes (Signed)
Pt to ED POV for chief complaint of RUQ pain that started yesterday, worsening with inspiration.  Denies shob Denies injury, N/V,fevers Pt appears very uncomfortable in triage

## 2020-12-23 NOTE — ED Notes (Signed)
Patient states it hurts to take a deep breath and needs to breathe shallowly and rapidly to deal with the pain. Patient states "it hurts when I burp." Patient asked this RN to sign any documents that were needed. This RN declined.
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
## 2020-12-23 NOTE — ED Notes (Signed)
Patient states she can't void at this time, but might be able to after drinking some water. Patient advised the provider will need to see her first.

## 2020-12-24 LAB — SARS CORONAVIRUS 2 (TAT 6-24 HRS): SARS Coronavirus 2: NEGATIVE

## 2020-12-26 LAB — URINE CULTURE
Culture: >=100000 — AB
Special Requests: NORMAL

## 2021-01-07 ENCOUNTER — Other Ambulatory Visit: Payer: Self-pay

## 2021-01-07 ENCOUNTER — Ambulatory Visit (LOCAL_COMMUNITY_HEALTH_CENTER): Payer: Self-pay

## 2021-01-07 VITALS — Ht 64.0 in | Wt 122.5 lb

## 2021-01-07 DIAGNOSIS — Z3042 Encounter for surveillance of injectable contraceptive: Secondary | ICD-10-CM

## 2021-01-07 DIAGNOSIS — Z3009 Encounter for other general counseling and advice on contraception: Secondary | ICD-10-CM

## 2021-01-07 NOTE — Progress Notes (Signed)
15 weeks 2 days post depo. Depo given today ( R UOQ) per order by Maximiano Coss, PA-C dated 03/08/2020. Tolerated well. Next depo and RP due 03/25/2021, pt aware. Jerel Shepherd, RN

## 2021-01-25 IMAGING — US US ABDOMEN LIMITED
1 series · 14 of 25 positions shown · non-contrast
Comparison: None.

CLINICAL DATA: Right upper quadrant pain

EXAM:
ULTRASOUND ABDOMEN LIMITED RIGHT UPPER QUADRANT

[Series 1: us abdomen limited · 0.13mm/px · 14 of 60 slices shown]
[im 1/60]
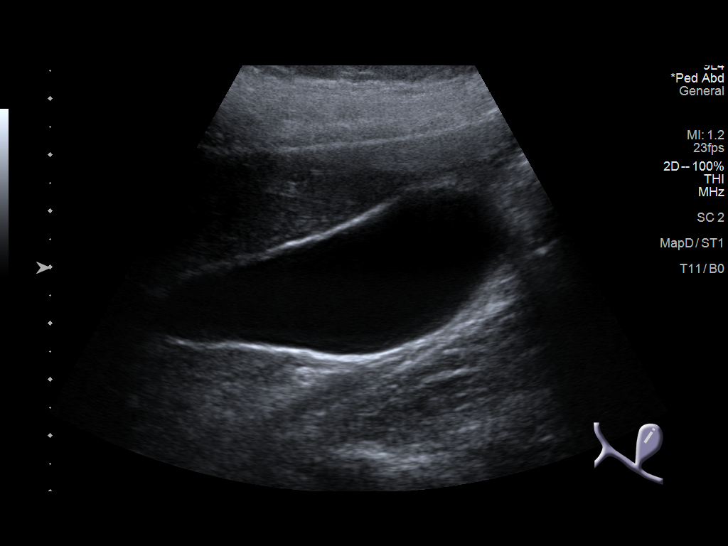
[im 5/60]
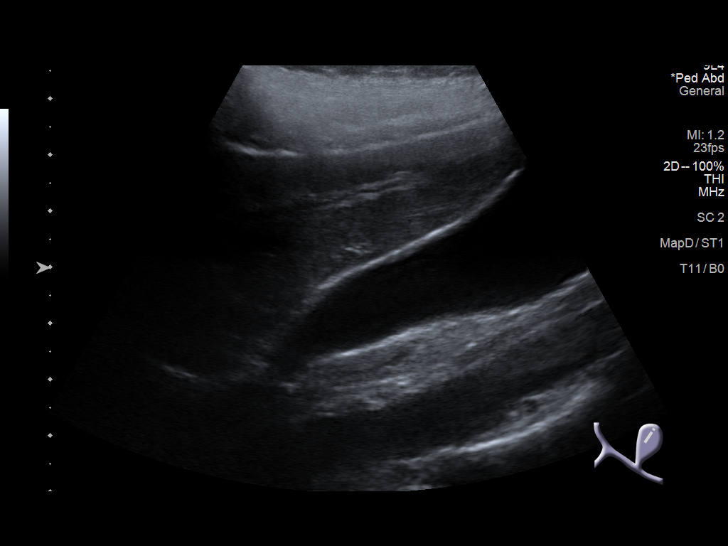
[im 10/60]
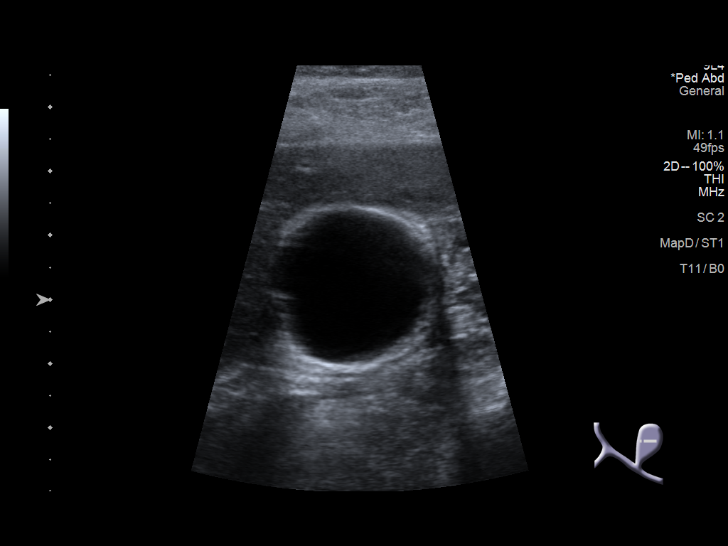
[im 15/60]
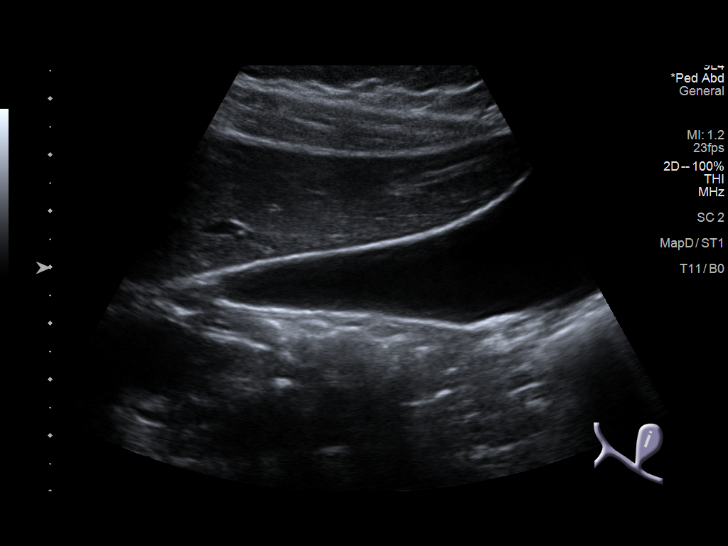
[im 20/60]
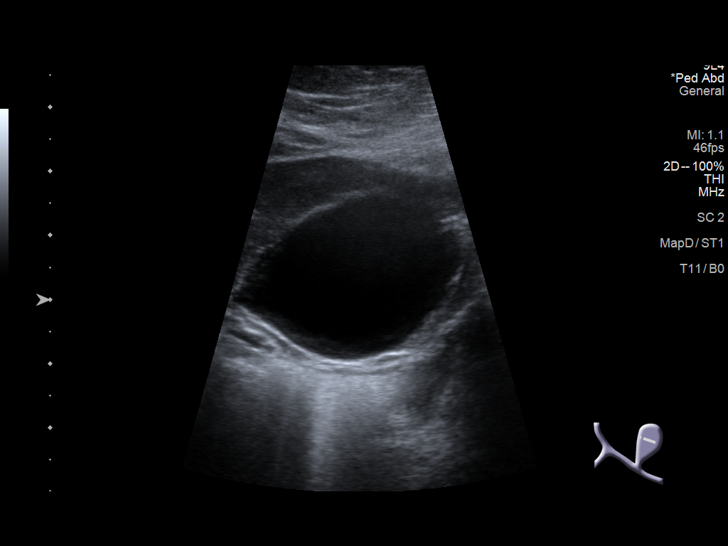
[im 23/60]
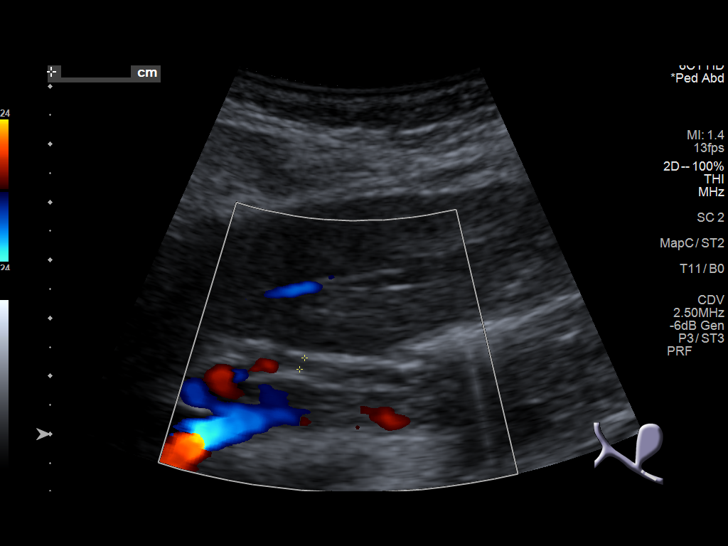
[im 28/60]
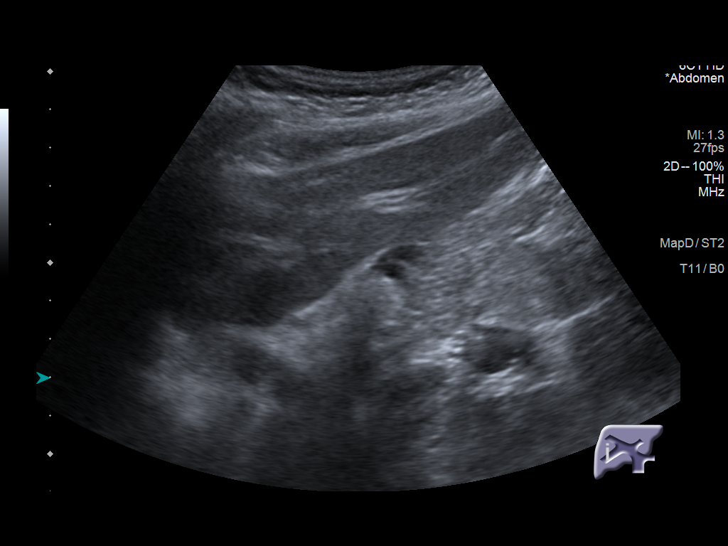
[im 32/60]
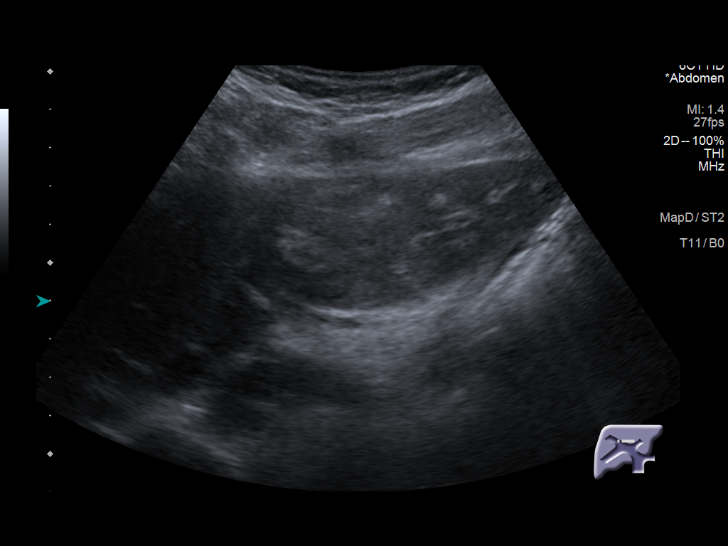
[im 37/60]
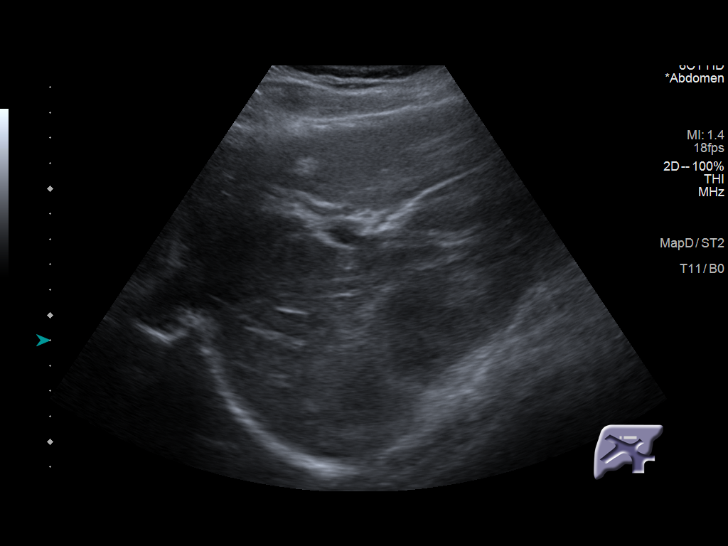
[im 40/60]
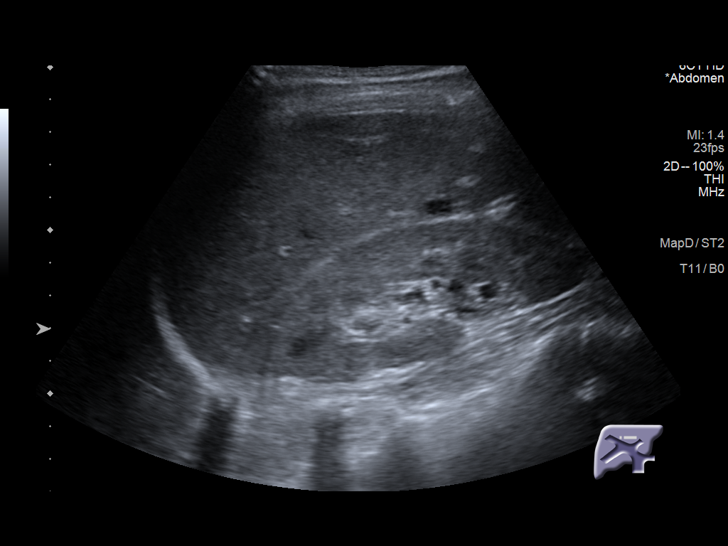
[im 45/60]
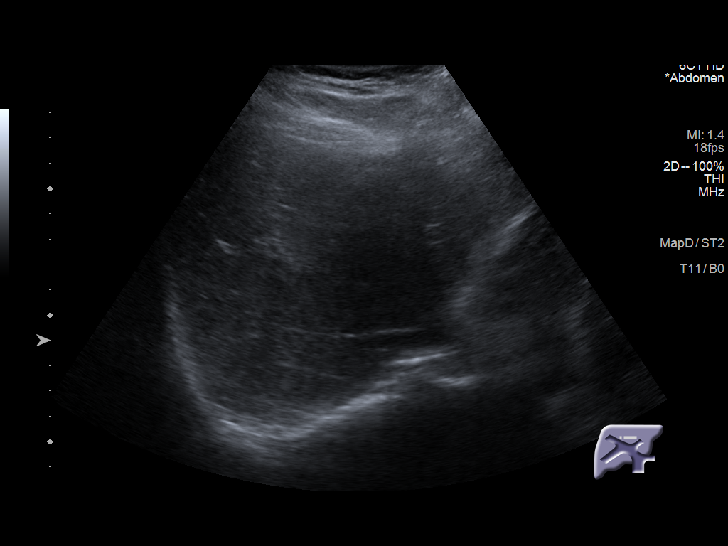
[im 50/60]
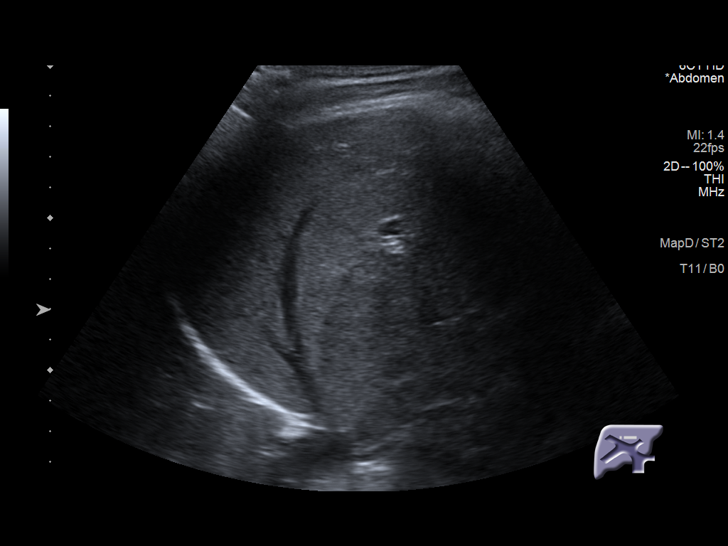
[im 55/60]
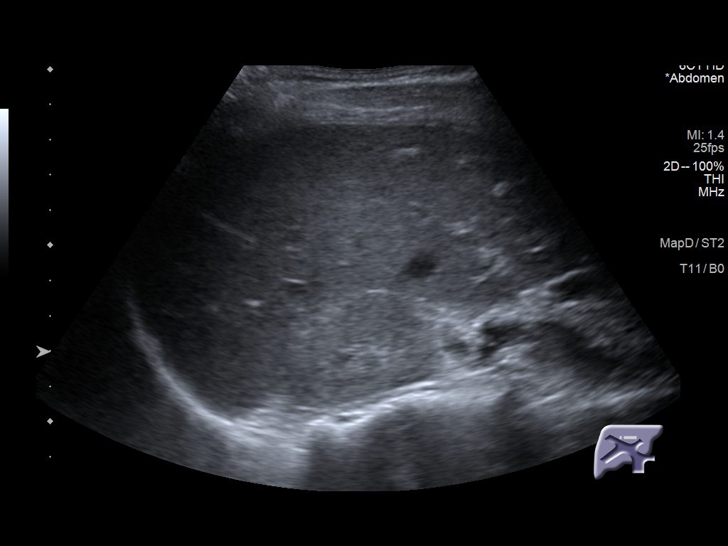
[im 60/60]
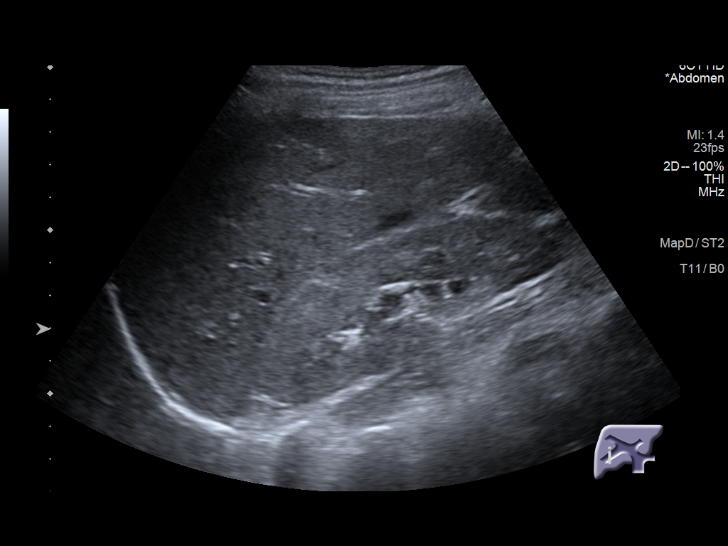

[14 of 25 positions shown; findings below may reference images not displayed]

FINDINGS: Gallbladder:

No gallstones or wall thickening visualized. No sonographic Murphy
sign noted by sonographer.

Common bile duct:

Diameter: 2 mm, normal

Liver:

No focal lesion identified. Within normal limits in parenchymal
echogenicity. Portal vein is patent on color Doppler imaging with
normal direction of blood flow towards the liver.

Other: None.
IMPRESSION: Normal ultrasound of the right upper quadrant.

## 2021-04-29 ENCOUNTER — Other Ambulatory Visit: Payer: Self-pay

## 2021-04-29 ENCOUNTER — Emergency Department
Admission: EM | Admit: 2021-04-29 | Discharge: 2021-04-29 | Disposition: A | Discharge status: Home / Self Care | Payer: Self-pay | Attending: Emergency Medicine | Admitting: Emergency Medicine

## 2021-04-29 DIAGNOSIS — Z28310 Unvaccinated for covid-19: Secondary | ICD-10-CM | POA: Insufficient documentation

## 2021-04-29 DIAGNOSIS — R5383 Other fatigue: Secondary | ICD-10-CM | POA: Insufficient documentation

## 2021-04-29 DIAGNOSIS — Z20822 Contact with and (suspected) exposure to covid-19: Secondary | ICD-10-CM | POA: Insufficient documentation

## 2021-04-29 DIAGNOSIS — M791 Myalgia, unspecified site: Secondary | ICD-10-CM | POA: Insufficient documentation

## 2021-04-29 DIAGNOSIS — R519 Headache, unspecified: Secondary | ICD-10-CM | POA: Insufficient documentation

## 2021-04-29 DIAGNOSIS — R6883 Chills (without fever): Secondary | ICD-10-CM | POA: Insufficient documentation

## 2021-04-29 DIAGNOSIS — F17210 Nicotine dependence, cigarettes, uncomplicated: Secondary | ICD-10-CM | POA: Insufficient documentation

## 2021-04-29 LAB — SARS CORONAVIRUS 2 (TAT 6-24 HRS): SARS Coronavirus 2: NEGATIVE

## 2021-04-29 MED ORDER — KETOROLAC TROMETHAMINE 30 MG/ML IJ SOLN
30.0000 mg | Freq: Once | INTRAMUSCULAR | Status: AC
  Administered 2021-04-29: 30 mg via INTRAMUSCULAR
  Filled 2021-04-29: qty 1

## 2021-04-29 MED ORDER — ACETAMINOPHEN 325 MG PO TABS
650.0000 mg | ORAL_TABLET | Freq: Once | ORAL | Status: AC
  Administered 2021-04-29: 650 mg via ORAL

## 2021-04-29 MED ORDER — ACETAMINOPHEN 325 MG PO TABS
ORAL_TABLET | ORAL | Status: AC
  Filled 2021-04-29: qty 2

## 2021-04-29 NOTE — ED Notes (Signed)
This RN in room to do rapid COVID test, RN informed pt we needed to swab her again for rapid test, pt states "hell naw, I ain't doing that shit again, that shit hurt". RN explained that the rapid swab does not have to go as far in her nose, pt stated "Naw, they already swabbed me twice and that shit hurt, I'm not doing that again".
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
## 2021-05-31 ENCOUNTER — Other Ambulatory Visit: Payer: Self-pay

## 2021-05-31 ENCOUNTER — Ambulatory Visit: Payer: Self-pay | Admitting: Physician Assistant

## 2021-05-31 DIAGNOSIS — Z113 Encounter for screening for infections with a predominantly sexual mode of transmission: Secondary | ICD-10-CM

## 2021-05-31 LAB — WET PREP FOR TRICH, YEAST, CLUE
Trichomonas Exam: NEGATIVE
Yeast Exam: NEGATIVE

## 2021-05-31 LAB — PREGNANCY, URINE: Preg Test, Ur: NEGATIVE

## 2021-06-01 ENCOUNTER — Encounter: Payer: Self-pay | Admitting: Physician Assistant
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
## 2021-06-05 ENCOUNTER — Other Ambulatory Visit: Payer: Self-pay

## 2021-06-05 ENCOUNTER — Emergency Department
Admission: EM | Admit: 2021-06-05 | Discharge: 2021-06-05 | Disposition: A | Discharge status: Home / Self Care | Payer: Self-pay | Attending: Emergency Medicine | Admitting: Emergency Medicine

## 2021-06-05 ENCOUNTER — Encounter: Payer: Self-pay | Admitting: Emergency Medicine

## 2021-06-05 DIAGNOSIS — M7989 Other specified soft tissue disorders: Secondary | ICD-10-CM | POA: Insufficient documentation

## 2021-06-05 DIAGNOSIS — F17210 Nicotine dependence, cigarettes, uncomplicated: Secondary | ICD-10-CM | POA: Insufficient documentation

## 2021-06-05 DIAGNOSIS — R21 Rash and other nonspecific skin eruption: Secondary | ICD-10-CM | POA: Insufficient documentation

## 2021-06-05 LAB — CHLAMYDIA/NGC RT PCR (ARMC ONLY)
Chlamydia Tr: NOT DETECTED
N gonorrhoeae: NOT DETECTED

## 2021-06-05 LAB — GROUP A STREP BY PCR: Group A Strep by PCR: NOT DETECTED

## 2021-06-05 MED ORDER — PENICILLIN G BENZATHINE 1200000 UNIT/2ML IM SUSY
2.4000 10*6.[IU] | PREFILLED_SYRINGE | Freq: Once | INTRAMUSCULAR | Status: AC
  Administered 2021-06-05: 2.4 10*6.[IU] via INTRAMUSCULAR
  Filled 2021-06-05: qty 4

## 2021-06-05 NOTE — ED Triage Notes (Signed)
Pt reports was here on March 30th and was give toradol and she is having an allergic rash from it on her body. Pt reports itching and some pain with itching. Pt reports she looked it up and knows the rash if from that medicine.
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
## 2021-06-05 NOTE — ED Notes (Signed)
See triage note  Presents with rash  States she developed rash to lower legs and ankles first  Then it is now on face,and torso

## 2021-06-05 NOTE — Discharge Instructions (Addendum)
If your syphilis test returns is positive you will need to get additional injections.  You will need to get an injection every week for 3 weeks.  You can follow-up with the health department for this treatment If your test is negative, and the rash continues for more than 1 week, please follow-up at [facility]

## 2021-06-06 LAB — RPR
RPR Ser Ql: REACTIVE — AB
RPR Titer: 1:64 {titer}

## 2021-06-07 ENCOUNTER — Telehealth: Payer: Self-pay | Admitting: Emergency Medicine

## 2021-06-07 ENCOUNTER — Other Ambulatory Visit: Payer: Self-pay

## 2021-06-07 ENCOUNTER — Ambulatory Visit: Payer: Self-pay | Admitting: Physician Assistant

## 2021-06-07 DIAGNOSIS — A539 Syphilis, unspecified: Secondary | ICD-10-CM

## 2021-06-07 DIAGNOSIS — Z113 Encounter for screening for infections with a predominantly sexual mode of transmission: Secondary | ICD-10-CM

## 2021-06-07 LAB — T.PALLIDUM AB, TOTAL: T Pallidum Abs: REACTIVE — AB

## 2021-06-07 NOTE — Telephone Encounter (Signed)
Called patient due to positive rpr.  Patient was treated and instructed to follow with achd, but did not have result yet.  Left message with my number.

## 2021-06-07 NOTE — Progress Notes (Signed)
S:  Patient into clinic for follow up after ER visit.  Patient states that she went to the ER due to a rash and was treated for Syphilis at that visit.  Reports that the rash has gotten worse/spread since she was treated.   O:  WDWN female in NAD, A&O x 3, normal work of breathing.  Per review of ER visit from 06/05/2021:  RPR reactive 1:64, T.palladium= pending. A/P: 1.  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 2.  Patient treated at the ER with Bicillin 2.4 mu IM. 3.  Counseled patient that she will not need further treatment at this time since due to her symptoms, she has had Syphilis for less than 1 year. 4.  Reassured patient that sometimes when Syphilis is treated with the big dose of Penicillin, a rash comes out as a reaction to the treatment. 5.  Counseled patient to use OTC antihistamines as needed for itching, cool shower or bath and soothing lotions. 6.  Counseled patient that she should not have sex for at least 14 days and until after her partner has been screened and treated. 7.  RTC in 6 months, 12 months and annually for titer checks for monitoring. 8.  Enc condoms with all sex. 9.  RTC prn.

## 2021-08-31 ENCOUNTER — Encounter: Payer: Self-pay | Admitting: Advanced Practice Midwife

## 2021-08-31 ENCOUNTER — Other Ambulatory Visit: Payer: Self-pay

## 2021-08-31 ENCOUNTER — Ambulatory Visit (LOCAL_COMMUNITY_HEALTH_CENTER): Payer: Self-pay | Admitting: Advanced Practice Midwife

## 2021-08-31 VITALS — BP 94/63 | Ht 65.0 in | Wt 123.8 lb

## 2021-08-31 DIAGNOSIS — A539 Syphilis, unspecified: Secondary | ICD-10-CM

## 2021-08-31 DIAGNOSIS — Z3042 Encounter for surveillance of injectable contraceptive: Secondary | ICD-10-CM

## 2021-08-31 DIAGNOSIS — N760 Acute vaginitis: Secondary | ICD-10-CM

## 2021-08-31 DIAGNOSIS — Z720 Tobacco use: Secondary | ICD-10-CM

## 2021-08-31 DIAGNOSIS — Z3009 Encounter for other general counseling and advice on contraception: Secondary | ICD-10-CM

## 2021-08-31 DIAGNOSIS — Z01419 Encounter for gynecological examination (general) (routine) without abnormal findings: Secondary | ICD-10-CM

## 2021-08-31 DIAGNOSIS — B9689 Other specified bacterial agents as the cause of diseases classified elsewhere: Secondary | ICD-10-CM

## 2021-08-31 LAB — HEMOGLOBIN, FINGERSTICK: Hemoglobin: 14.2 g/dL (ref 11.1–15.9)

## 2021-08-31 LAB — HM HIV SCREENING LAB: HM HIV Screening: NEGATIVE

## 2021-08-31 LAB — WET PREP FOR TRICH, YEAST, CLUE
Trichomonas Exam: NEGATIVE
Yeast Exam: NEGATIVE

## 2021-08-31 MED ORDER — PENICILLIN G BENZATHINE 1200000 UNIT/2ML IM SUSY
2.4000 10*6.[IU] | PREFILLED_SYRINGE | Freq: Once | INTRAMUSCULAR | Status: AC
  Administered 2021-08-31: 2.4 10*6.[IU] via INTRAMUSCULAR

## 2021-08-31 MED ORDER — METRONIDAZOLE 500 MG PO TABS: 500.0000 mg | ORAL_TABLET | Freq: Two times a day (BID) | ORAL | 0 refills | Status: AC

## 2021-08-31 NOTE — Progress Notes (Signed)
Patient here for PE and STD testing. States her period just ended and she had bleeding with some "black" discharge for the past one week, with a bad smell. Last PE was 03/08/2020, last Pap test was ASCUS, HPV negative. Last Depo was 01/07/2021. Needs syphilis titer.Burt Knack, RN
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
## 2021-08-31 NOTE — Progress Notes (Addendum)
Wet prep reviewed, patient treated for BV per SO. Hgb =14.2.  Patient treated with Bicillin, one dose, 2.4 mU, due to having sex with untreated partner, counseled no sex for 14 days. Patient's partner was also treated today. Patient states understanding.Burt Knack, RN

## 2021-09-01 LAB — HM HEPATITIS C SCREENING LAB: HM Hepatitis Screen: NEGATIVE

## 2021-12-07 ENCOUNTER — Ambulatory Visit: Payer: Self-pay

## 2021-12-08 ENCOUNTER — Ambulatory Visit: Payer: Self-pay

## 2021-12-15 ENCOUNTER — Ambulatory Visit: Payer: Self-pay

## 2022-02-08 ENCOUNTER — Ambulatory Visit: Payer: Self-pay

## 2022-05-03 ENCOUNTER — Ambulatory Visit: Payer: Self-pay

## 2022-06-02 ENCOUNTER — Ambulatory Visit: Payer: Self-pay | Admitting: Nurse Practitioner

## 2022-06-02 ENCOUNTER — Encounter: Payer: Self-pay | Admitting: Nurse Practitioner

## 2022-06-02 DIAGNOSIS — Z3200 Encounter for pregnancy test, result unknown: Secondary | ICD-10-CM

## 2022-06-02 DIAGNOSIS — Z113 Encounter for screening for infections with a predominantly sexual mode of transmission: Secondary | ICD-10-CM

## 2022-06-02 LAB — WET PREP FOR TRICH, YEAST, CLUE
Trichomonas Exam: NEGATIVE
Yeast Exam: NEGATIVE

## 2022-06-02 LAB — PREGNANCY, URINE: Preg Test, Ur: NEGATIVE

## 2022-06-02 MED ORDER — PRENATAL VITAMIN AND MINERAL 28-0.8 MG PO TABS: 1.0000 | ORAL_TABLET | Freq: Every day | ORAL | 0 refills | Status: DC
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
## 2022-12-09 DIAGNOSIS — R0789 Other chest pain: Secondary | ICD-10-CM | POA: Diagnosis not present

## 2022-12-09 DIAGNOSIS — R059 Cough, unspecified: Secondary | ICD-10-CM | POA: Diagnosis not present

## 2022-12-09 DIAGNOSIS — R0981 Nasal congestion: Secondary | ICD-10-CM | POA: Diagnosis not present

## 2022-12-09 DIAGNOSIS — R69 Illness, unspecified: Secondary | ICD-10-CM | POA: Diagnosis not present

## 2022-12-09 DIAGNOSIS — U071 COVID-19: Secondary | ICD-10-CM | POA: Diagnosis not present

## 2022-12-09 DIAGNOSIS — R519 Headache, unspecified: Secondary | ICD-10-CM | POA: Diagnosis not present

## 2022-12-11 DIAGNOSIS — A539 Syphilis, unspecified: Secondary | ICD-10-CM

## 2022-12-11 HISTORY — DX: Syphilis, unspecified: A53.9

## 2022-12-14 ENCOUNTER — Ambulatory Visit: Payer: Self-pay

## 2023-02-01 ENCOUNTER — Ambulatory Visit: Payer: Self-pay | Admitting: Family

## 2023-02-01 ENCOUNTER — Encounter: Payer: Self-pay | Admitting: Family

## 2023-02-01 VITALS — BP 109/73 | Ht 65.0 in | Wt 115.5 lb

## 2023-02-01 DIAGNOSIS — Z01419 Encounter for gynecological examination (general) (routine) without abnormal findings: Secondary | ICD-10-CM

## 2023-02-01 DIAGNOSIS — A599 Trichomoniasis, unspecified: Secondary | ICD-10-CM

## 2023-02-01 DIAGNOSIS — Z3200 Encounter for pregnancy test, result unknown: Secondary | ICD-10-CM

## 2023-02-01 LAB — PREGNANCY, URINE: Preg Test, Ur: NEGATIVE

## 2023-02-01 LAB — WET PREP FOR TRICH, YEAST, CLUE
Trichomonas Exam: POSITIVE — AB
Yeast Exam: NEGATIVE

## 2023-02-01 LAB — HM HIV SCREENING LAB: HM HIV Screening: NEGATIVE

## 2023-02-01 MED ORDER — METRONIDAZOLE 500 MG PO TABS: 500.0000 mg | ORAL_TABLET | Freq: Two times a day (BID) | ORAL | 0 refills | Status: AC

## 2023-02-01 NOTE — Progress Notes (Signed)
Pt is here for PE, UPT and STD screening. Pt notified of negative UPT and wet mount results  The patient was dispensed Metronidazole 500 mg #14 today. I provided counseling today regarding the medication. We discussed the medication, the side effects and when to call clinic. Patient given the opportunity to ask questions. Questions answered. Contact card x2 and FP packet given to patient.  Windle Guard, RN
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
## 2023-04-18 ENCOUNTER — Ambulatory Visit: Payer: Self-pay | Admitting: Family Medicine

## 2023-04-18 ENCOUNTER — Encounter: Payer: Self-pay | Admitting: Family Medicine

## 2023-04-18 DIAGNOSIS — Z113 Encounter for screening for infections with a predominantly sexual mode of transmission: Secondary | ICD-10-CM

## 2023-04-18 DIAGNOSIS — Z62810 Personal history of physical and sexual abuse in childhood: Secondary | ICD-10-CM

## 2023-04-18 DIAGNOSIS — Z8659 Personal history of other mental and behavioral disorders: Secondary | ICD-10-CM

## 2023-04-18 DIAGNOSIS — A539 Syphilis, unspecified: Secondary | ICD-10-CM

## 2023-04-18 LAB — WET PREP FOR TRICH, YEAST, CLUE
Trichomonas Exam: NEGATIVE
Yeast Exam: NEGATIVE

## 2023-04-18 LAB — HEPATITIS B SURFACE ANTIGEN: Hepatitis B Surface Ag: NONREACTIVE

## 2023-04-18 LAB — HM HIV SCREENING LAB: HM HIV Screening: NEGATIVE

## 2023-04-18 LAB — HM HEPATITIS C SCREENING LAB: HM Hepatitis Screen: NEGATIVE

## 2023-04-18 NOTE — Progress Notes (Signed)
Pt appointment for STI. Seen by FNP Sydnee Levans. Wet prep results all negative and reviewed with pt.
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
## 2023-04-26 ENCOUNTER — Telehealth: Payer: Self-pay

## 2023-04-26 NOTE — Telephone Encounter (Signed)
LM for pt to return my call

## 2023-05-01 ENCOUNTER — Telehealth: Payer: Self-pay

## 2023-05-01 NOTE — Telephone Encounter (Signed)
Lm on cell phone  and with pt's mother.

## 2023-05-02 ENCOUNTER — Ambulatory Visit: Payer: Self-pay

## 2023-05-02 ENCOUNTER — Telehealth: Payer: Self-pay

## 2023-05-02 DIAGNOSIS — A539 Syphilis, unspecified: Secondary | ICD-10-CM

## 2023-05-02 MED ORDER — PENICILLIN G BENZATHINE 1200000 UNIT/2ML IM SUSY
2.4000 10*6.[IU] | PREFILLED_SYRINGE | Freq: Once | INTRAMUSCULAR | Status: AC
Start: 2023-05-02 — End: 2023-05-02
  Administered 2023-05-02: 2.4 10*6.[IU] via INTRAMUSCULAR

## 2023-05-02 NOTE — Progress Notes (Signed)
Pt is here for treatment of Syphilis.  Bicillin 2.4 MU given IM.  Pt monitored x 15 minutes and tolerated well.  Pt informed to return in  6 months for repeat labs.  Condoms given.  Berdie Ogren, RN

## 2023-05-02 NOTE — Telephone Encounter (Signed)
No additional notes

## 2023-08-10 DIAGNOSIS — H1032 Unspecified acute conjunctivitis, left eye: Secondary | ICD-10-CM | POA: Insufficient documentation

## 2023-08-10 NOTE — ED Triage Notes (Signed)
Pt reports left eye drainage for the past 3 weeks. Pt talks in complete sentences no distress noted.

## 2023-08-11 ENCOUNTER — Emergency Department
Admission: EM | Admit: 2023-08-11 | Discharge: 2023-08-11 | Disposition: A | Discharge status: Home / Self Care | Payer: Self-pay | Attending: Emergency Medicine | Admitting: Emergency Medicine

## 2023-08-11 DIAGNOSIS — H1032 Unspecified acute conjunctivitis, left eye: Secondary | ICD-10-CM

## 2023-08-11 MED ORDER — POLYMYXIN B-TRIMETHOPRIM 10000-0.1 UNIT/ML-% OP SOLN: 2.0000 [drp] | Freq: Four times a day (QID) | OPHTHALMIC | 0 refills | Status: AC

## 2023-08-11 MED ORDER — TETRACAINE HCL 0.5 % OP SOLN
2.0000 [drp] | Freq: Once | OPHTHALMIC | Status: AC
  Administered 2023-08-11: 2 [drp] via OPHTHALMIC
  Filled 2023-08-11: qty 4

## 2023-08-11 MED ORDER — FLUORESCEIN SODIUM 1 MG OP STRP
1.0000 | ORAL_STRIP | Freq: Once | OPHTHALMIC | Status: AC
  Administered 2023-08-11: 1 via OPHTHALMIC
  Filled 2023-08-11: qty 1

## 2023-08-11 NOTE — ED Provider Notes (Signed)
[facility] Provider Note    Event Date/Time   First MD Initiated Contact with Patient 08/11/23 0117     (approximate)   History   Eye Drainage   HPI  Cynthia Davies is a 37 y.o. female with no significant past medical history who presents to the emergency department 3 weeks of yellow-white drainage coming from her left eye with left eye irritation and blurry vision.  Does not wear contacts or glasses.  No injury to the eye.  Has not been putting anything into the eye.   History provided by patient.    Past Medical History:  Diagnosis Date   Anemia    History of stillbirth 08/31/2015   Medical history non-contributory    Vaginal Pap smear, abnormal     Past Surgical History:  Procedure Laterality Date   CESAREAN SECTION  2008    MEDICATIONS:  Prior to Admission medications   Medication Sig Start Date End Date Taking? Authorizing Provider  Prenatal Vit-Fe Fumarate-FA (PRENATAL VITAMIN AND MINERAL) 28-0.8 MG TABS Take 1 tablet by mouth daily. Patient not taking: Reported on 02/01/2023 06/02/22   Glenna Fellows, FNP    Physical Exam   Triage Vital Signs: ED Triage Vitals  Encounter Vitals Group     BP 08/10/23 2348 104/79     Systolic BP Percentile --      Diastolic BP Percentile --      Pulse Rate 08/10/23 2348 85     Resp 08/10/23 2348 16     Temp 08/10/23 2348 97.8 F (36.6 C)     Temp Source 08/10/23 2348 Oral     SpO2 08/10/23 2348 99 %     Weight 08/10/23 2351 123 lb (55.8 kg)     Height 08/10/23 2351 5\' 5"  (1.651 m)     Head Circumference --      Peak Flow --      Pain Score 08/10/23 2351 6     Pain Loc --      Pain Education --      Exclude from Growth Chart --     Most recent vital signs: Vitals:   08/10/23 2348 08/11/23 0155  BP: 104/79 105/71  Pulse: 85 61  Resp: 16 16  Temp: 97.8 F (36.6 C) 98 F (36.7 C)  SpO2: 99% 100%     CONSTITUTIONAL: Alert and responds appropriately to questions. Well-appearing;  well-nourished HEAD: Normocephalic, atraumatic EYES: Conjunctive on the left is injected, no chemosis, pupils appear equal, extraocular movements intact, no fluorescein uptake in the left eye, intraocular pressure is 11 mmHg, no hyphema or hypopyon, no globe injury, yellow drainage noted at the medial canthus, eyelids and eyelashes appear normal, right eye appears normal ENT: normal nose; moist mucous membranes NECK: Normal range of motion CARD: Regular rate and rhythm RESP: Normal chest excursion without splinting or tachypnea; no hypoxia or respiratory distress, speaking full sentences ABD/GI: non-distended EXT: Normal ROM in all joints, no major deformities noted SKIN: Normal color for age and race, no rashes on exposed skin NEURO: Moves all extremities equally, normal speech, no facial asymmetry noted PSYCH: The patient's mood and manner are appropriate. Grooming and personal hygiene are appropriate.  ED Results / Procedures / Treatments   LABS: (all labs ordered are listed, but only abnormal results are displayed) Labs Reviewed - No data to display   EKG:    RADIOLOGY: My personal review and interpretation of imaging:    I have personally reviewed all  radiology reports. No results found.   PROCEDURES:  Critical Care performed:    CRITICAL CARE Performed by: Rochele Raring   Total critical care time: 0 minutes  Critical care time was exclusive of separately billable procedures and treating other patients.  Critical care was necessary to treat or prevent imminent or life-threatening deterioration.  Critical care was time spent personally by me on the following activities: development of treatment plan with patient and/or surrogate as well as nursing, discussions with consultants, evaluation of patient's response to treatment, examination of patient, obtaining history from patient or surrogate, ordering and performing treatments and interventions, ordering and review of  laboratory studies, ordering and review of radiographic studies, pulse oximetry and re-evaluation of patient's condition.   Procedures    IMPRESSION / MDM / ASSESSMENT AND PLAN / ED COURSE  I reviewed the triage vital signs and the nursing notes.   Patient here with left eye conjunctivitis.     DIFFERENTIAL DIAGNOSIS (includes but not limited to):   Conjunctivitis, doubt endophthalmitis, globe injury, glaucoma  Patient's presentation is most consistent with acute complicated illness / injury requiring diagnostic workup.  PLAN: Please see nursing notes for visual acuity but eye exam is reassuring other than conjunctival injection and drainage.  Suspect that this is conjunctivitis.  Will discharge with Polytrim.  Will give ophthalmology follow-up.  Recommend Tylenol, Motrin as needed.  No signs of endophthalmitis, globe injury, glaucoma on exam.   MEDICATIONS GIVEN IN ED: Medications  fluorescein ophthalmic strip 1 strip (1 strip Left Eye Given 08/11/23 0121)  tetracaine (PONTOCAINE) 0.5 % ophthalmic solution 2 drop (2 drops Left Eye Given 08/11/23 0121)     ED COURSE:  At this time, I do not feel there is any life-threatening condition present. I reviewed all nursing notes, vitals, pertinent previous records.  All lab and urine results, EKGs, imaging ordered have been independently reviewed and interpreted by myself.  I reviewed all available radiology reports from any imaging ordered this visit.  Based on my assessment, I feel the patient is safe to be discharged home without further emergent workup and can continue workup as an outpatient as needed. Discussed all findings, treatment plan as well as usual and customary return precautions.  They verbalize understanding and are comfortable with this plan.  Outpatient follow-up has been provided as needed.  All questions have been answered.    CONSULTS:  none   OUTSIDE RECORDS REVIEWED: Reviewed last OB/GYN notes at [facility] in  2017.     FINAL CLINICAL IMPRESSION(S) / ED DIAGNOSES   Final diagnoses:  Acute bacterial conjunctivitis of left eye     Rx / DC Orders   ED Discharge Orders          Ordered    trimethoprim-polymyxin b (POLYTRIM) ophthalmic solution  Every 6 hours        08/11/23 0140             Note:  This document was prepared using Dragon voice recognition software and may include unintentional dictation errors.   Raif Chachere, Layla Maw, DO 08/11/23 [phone]

## 2023-08-11 NOTE — Discharge Instructions (Signed)
You may alternate Tylenol 1000 mg every 6 hours as needed for pain, fever and Ibuprofen 800 mg every 6-8 hours as needed for pain, fever.  Please take Ibuprofen with food.  Do not take more than 4000 mg of Tylenol (acetaminophen) in a 24 hour period. ° °

## 2023-08-17 ENCOUNTER — Ambulatory Visit: Payer: Self-pay

## 2024-01-18 ENCOUNTER — Ambulatory Visit: Payer: Self-pay

## 2024-02-13 ENCOUNTER — Ambulatory Visit (LOCAL_COMMUNITY_HEALTH_CENTER): Payer: Self-pay

## 2024-02-13 VITALS — BP 100/61 | Ht 65.0 in | Wt 120.5 lb

## 2024-02-13 DIAGNOSIS — Z3009 Encounter for other general counseling and advice on contraception: Secondary | ICD-10-CM

## 2024-02-13 DIAGNOSIS — Z3201 Encounter for pregnancy test, result positive: Secondary | ICD-10-CM

## 2024-02-13 LAB — PREGNANCY, URINE: Preg Test, Ur: POSITIVE — AB

## 2024-02-13 MED ORDER — PRENATAL 27-0.8 MG PO TABS
1.0000 | ORAL_TABLET | Freq: Every day | ORAL | Status: AC
Start: 2024-02-13 — End: 2024-05-23

## 2024-02-13 NOTE — Progress Notes (Signed)
 UPT positive. Plans to receive prenatal care at either a [facility] clinic or [facility]; advised to establish care ASAP.   The patient was dispensed prenatal vitamins #100 today. I provided counseling today regarding the medication. We discussed the medication, the side effects and when to call clinic.   Positive pregnancy packet reviewed and given to patient. Also counseled on hydration and when to seek medical attention.   Patient given the opportunity to ask questions. Questions answered.   Abagail Kitchens, RN

## 2024-02-22 ENCOUNTER — Telehealth: Payer: Self-pay

## 2024-02-22 DIAGNOSIS — Z3480 Encounter for supervision of other normal pregnancy, unspecified trimester: Secondary | ICD-10-CM

## 2024-02-22 DIAGNOSIS — Z3687 Encounter for antenatal screening for uncertain dates: Secondary | ICD-10-CM

## 2024-02-22 DIAGNOSIS — O0990 Supervision of high risk pregnancy, unspecified, unspecified trimester: Secondary | ICD-10-CM | POA: Insufficient documentation

## 2024-02-22 DIAGNOSIS — Z3689 Encounter for other specified antenatal screening: Secondary | ICD-10-CM
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
## 2024-02-22 NOTE — Patient Instructions (Signed)
 Commonly Asked Questions During Pregnancy  Cats: A parasite can be excreted in cat feces.  To avoid exposure you need to have another person empty the little box.  If you must empty the litter box you will need to wear gloves.  Wash your hands after handling your cat.  This parasite can also be found in raw or undercooked meat so this should also be avoided.  Colds, Sore Throats, Flu: Please check your medication sheet to see what you can take for symptoms.  If your symptoms are unrelieved by these medications please call the office.  Dental Work: Most any dental work Agricultural consultant recommends is permitted.  X-rays should only be taken during the first trimester if absolutely necessary.  Your abdomen should be shielded with a lead apron during all x-rays.  Please notify your provider prior to receiving any x-rays.  Novocaine is fine; gas is not recommended.  If your dentist requires a note from [place] prior to dental work please call the office and we will provide one for you.  Exercise: Exercise is an important part of staying healthy during your pregnancy.  You may continue most exercises you were accustomed to prior to pregnancy.  Later in your pregnancy you will most likely notice you have difficulty with activities requiring balance like riding a bicycle.  It is important that you listen to your body and avoid activities that put you at a higher risk of falling.  Adequate rest and staying well hydrated are a must!  If you have questions about the safety of specific activities ask your provider.    Exposure to Children with illness: Try to avoid obvious exposure; report any symptoms to [place] when noted,  If you have chicken pos, red measles or mumps, you should be immune to these diseases.   Please do not take any vaccines while pregnant unless you have checked with your OB provider.  Fetal Movement: After 28 weeks we recommend you do "kick counts" twice daily.  Lie or sit down in a calm quiet environment and  count your baby movements "kicks".  You should feel your baby at least 10 times per hour.  If you have not felt 10 kicks within the first hour get up, walk around and have something sweet to eat or drink then repeat for an additional hour.  If count remains less than 10 per hour notify your provider.  Fumigating: Follow your pest control agent's advice as to how long to stay out of your home.  Ventilate the area well before re-entering.  Hemorrhoids:   Most over-the-counter preparations can be used during pregnancy.  Check your medication to see what is safe to use.  It is important to use a stool softener or fiber in your diet and to drink lots of liquids.  If hemorrhoids seem to be getting worse please call the office.   Hot Tubs:  Hot tubs Jacuzzis and saunas are not recommended while pregnant.  These increase your internal body temperature and should be avoided.  Intercourse:  Sexual intercourse is safe during pregnancy as long as you are comfortable, unless otherwise advised by your provider.  Spotting may occur after intercourse; report any bright red bleeding that is heavier than spotting.  Labor:  If you know that you are in labor, please go to the hospital.  If you are unsure, please call the office and let us help you decide what to do.  Lifting, straining, etc:  If your job requires heavy  lifting or straining please check with your provider for any limitations.  Generally, you should not lift items heavier than that you can lift simply with your hands and arms (no back muscles)  Painting:  Paint fumes do not harm your pregnancy, but may make you ill and should be avoided if possible.  Latex or water based paints have less odor than oils.  Use adequate ventilation while painting.  Permanents & Hair Color:  Chemicals in hair dyes are not recommended as they cause increase hair dryness which can increase hair loss during pregnancy.  " Highlighting" and permanents are allowed.  Dye may be  absorbed differently and permanents may not hold as well during pregnancy.  Sunbathing:  Use a sunscreen, as skin burns easily during pregnancy.  Drink plenty of fluids; avoid over heating.  Tanning Beds:  Because their possible side effects are still unknown, tanning beds are not recommended.  Ultrasound Scans:  Routine ultrasounds are performed at approximately 20 weeks.  You will be able to see your baby's general anatomy an if you would like to know the gender this can usually be determined as well.  If it is questionable when you conceived you may also receive an ultrasound early in your pregnancy for dating purposes.  Otherwise ultrasound exams are not routinely performed unless there is a medical necessity.  Although you can request a scan we ask that you pay for it when conducted because insurance does not cover " patient request" scans.  Work: If your pregnancy proceeds without complications you may work until your due date, unless your physician or employer advises otherwise.  Round Ligament Pain/Pelvic Discomfort:  Sharp, shooting pains not associated with bleeding are fairly common, usually occurring in the second trimester of pregnancy.  They tend to be worse when standing up or when you remain standing for long periods of time.  These are the result of pressure of certain pelvic ligaments called "round ligaments".  Rest, Tylenol and heat seem to be the most effective relief.  As the womb and fetus grow, they rise out of the pelvis and the discomfort improves.  Please notify the office if your pain seems different than that described.  It may represent a more serious condition.  First Trimester of Pregnancy  The first trimester of pregnancy starts on the first day of your last monthly period until the end of week 13. This is months 1 through 3 of pregnancy. A week after a sperm fertilizes an egg, the egg will implant into the wall of the uterus and begin to develop into a baby. Body  changes during your first trimester Your body goes through many changes during pregnancy. The changes usually return to normal after your baby is born. Physical changes Your breasts may grow larger and may hurt. The area around your nipples may get darker. Your periods will stop. Your hair and nails may grow faster. You may pee more often. Health changes You may tire easily. Your gums may bleed and may be sensitive when you brush and floss. You may not feel hungry. You may have heartburn. You may throw up or feel like you may throw up. You may want to eat some foods, but not others. You may have headaches. You may have trouble pooping (constipation). Other changes Your emotions may change from day to day. You may have more dreams. Follow these instructions at home: Medicines Talk to your health care provider if you're taking medicines. Ask if the medicines are  safe to take during pregnancy. Your provider may change the medicines that you take. Do not take any medicines unless told to by your provider. Take a prenatal vitamin that has at least 600 micrograms (mcg) of folic acid. Do not use herbal medicines, illegal substances, or medicines that are not approved by your provider. Eating and drinking While you're pregnant your body needs extra food for your growing baby. Talk with your provider about what to eat while pregnant. Activity Most women are able to exercise during pregnancy. Exercises may need to change as your pregnancy goes on. Talk to your provider about your activities and exercise routines. Relieving pain and discomfort Wear a good, supportive bra if your breasts hurt. Rest with your legs raised if you have leg cramps or low back pain. Safety Wear your seatbelt at all times when you're in a car. Talk to your provider if someone hits you, hurts you, or yells at you. Talk with your provider if you're feeling sad or have thoughts of hurting yourself. Lifestyle Certain  things can be harmful while you're pregnant. Follow these rules: Do not use hot tubs, steam rooms, or saunas. Do not douche. Do not use tampons or scented pads. Do not drink alcohol,smoke, vape, or use products with nicotine or tobacco in them. If you need help quitting, talk with your provider. Avoid cat litter boxes and soil used by cats. These things carry germs that can cause harm to your pregnancy and your baby. General instructions Keep all follow-up visits. It helps you and your unborn baby stay as healthy as possible. Write down your questions. Take them to your visits. Your provider will: Talk with you about your overall health. Give you advice or refer you to specialists who can help with different needs, including: Prenatal education classes. Mental health and counseling. Foods and healthy eating. Ask for help if you need help with food. Call your dentist and ask to be seen. Brush your teeth with a soft toothbrush. Floss gently. Where to find more information American Pregnancy Association: americanpregnancy.org [employer]: acog.org Office on [employer] Health: TravelLess[employer] Contact a health care provider if: You feel dizzy, faint, or have a fever. You vomit or have watery poop (diarrhea) for 2 days or more. You have abnormal discharge or bleeding from your vagina. You have pain when you pee or your pee smells bad. You have cramps, pain, or pressure in your belly area. Get help right away if: You have trouble breathing or chest pain. You have any kind of injury, such as from a fall or a car crash. These symptoms may be an emergency. Get help right away. Call 911. Do not wait to see if the symptoms will go away. Do not drive yourself to the hospital. This information is not intended to replace advice given to you by your health care provider. Make sure you discuss any questions you have with your health care provider. Document Revised:  08/30/2023 Document Reviewed: 03/30/2023 Elsevier Patient Education  2024 [employer].Common Medications Safe in Pregnancy  Acne:      Constipation:  Benzoyl Peroxide     Colace  Clindamycin      Dulcolax Suppository  Topica Erythromycin     Fibercon  Salicylic Acid      Metamucil         Miralax AVOID:        Senakot   Accutane    Cough:  Retin-A       Cough  Drops  Tetracycline      Phenergan w/ Codeine if Rx  Minocycline      Robitussin (Plain & DM)  Antibiotics:     Crabs/Lice:  Ceclor       RID  Cephalosporins    AVOID:  E-Mycins      Kwell  Keflex  Macrobid/Macrodantin   Diarrhea:  Penicillin      Kao-Pectate  Zithromax      Imodium AD         PUSH FLUIDS AVOID:       Cipro     Fever:  Tetracycline      Tylenol (Regular or Extra  Minocycline       Strength)  Levaquin      Extra Strength-Do not          Exceed 8 tabs/24 hrs Caffeine:        200mg /day (equiv. To 1 cup of coffee or  approx. 3 12 oz sodas)         Gas: Cold/Hayfever:       Gas-X  Benadryl      Mylicon  Claritin       Phazyme  **Claritin-D        Chlor-Trimeton    Headaches:  Dimetapp      ASA-Free Excedrin  Drixoral-Non-Drowsy     Cold Compress  Mucinex (Guaifenasin)     Tylenol (Regular or Extra  Sudafed/Sudafed-12 Hour     Strength)  **Sudafed PE Pseudoephedrine   Tylenol Cold & Sinus     Vicks Vapor Rub  Zyrtec  **AVOID if Problems With Blood Pressure         Heartburn: Avoid lying down for at least 1 hour after meals  Aciphex      Maalox     Rash:  Milk of Magnesia     Benadryl    Mylanta       1% Hydrocortisone Cream  Pepcid  Pepcid Complete   Sleep Aids:  Prevacid      Ambien   Prilosec       Benadryl  Rolaids       Chamomile Tea  Tums (Limit 4/day)     Unisom         Tylenol PM         Warm milk-add vanilla or  Hemorrhoids:       Sugar for taste  Anusol/Anusol H.C.  (RX: Analapram 2.5%)  Sugar Substitutes:  Hydrocortisone OTC     Ok in moderation  Preparation  H      Tucks        Vaseline lotion applied to tissue with wiping    Herpes:     Throat:  Acyclovir      Oragel  Famvir  Valtrex     Vaccines:         Flu Shot Leg Cramps:       *Gardasil  Benadryl      Hepatitis A         Hepatitis B Nasal Spray:       Pneumovax  Saline Nasal Spray     Polio Booster         Tetanus Nausea:       Tuberculosis test or PPD  Vitamin B6 25 mg TID   AVOID:    Dramamine      *Gardasil  Emetrol       Live Poliovirus  Ginger Root 250 mg QID    MMR (measles, mumps &  High  Complex Carbs @ Bedtime    rebella)  Sea Bands-Accupressure    Varicella (Chickenpox)  Unisom 1/2 tab TID     *No known complications           If received before Pain:         Known pregnancy;   Darvocet       Resume series after  Lortab        Delivery  Percocet    Yeast:   Tramadol      Femstat  Tylenol 3      Gyne-lotrimin  Ultram       Monistat  Vicodin           MISC:         All Sunscreens           Hair Coloring/highlights          Insect Repellant's          (Including DEET)         Mystic Tans

## 2024-03-14 ENCOUNTER — Other Ambulatory Visit: Payer: Self-pay

## 2024-03-19 ENCOUNTER — Ambulatory Visit
Admission: RE | Admit: 2024-03-19 | Discharge: 2024-03-19 | Disposition: A | Discharge status: Home / Self Care | Source: Ambulatory Visit | Attending: Certified Nurse Midwife | Admitting: Certified Nurse Midwife

## 2024-03-19 DIAGNOSIS — Z3480 Encounter for supervision of other normal pregnancy, unspecified trimester: Secondary | ICD-10-CM | POA: Diagnosis present

## 2024-03-19 DIAGNOSIS — Z3687 Encounter for antenatal screening for uncertain dates: Secondary | ICD-10-CM | POA: Diagnosis not present

## 2024-03-19 DIAGNOSIS — O09511 Supervision of elderly primigravida, first trimester: Secondary | ICD-10-CM | POA: Diagnosis not present

## 2024-03-19 DIAGNOSIS — Z3A11 11 weeks gestation of pregnancy: Secondary | ICD-10-CM | POA: Insufficient documentation

## 2024-03-21 ENCOUNTER — Other Ambulatory Visit

## 2024-03-31 ENCOUNTER — Other Ambulatory Visit (HOSPITAL_COMMUNITY)
Admission: RE | Admit: 2024-03-31 | Discharge: 2024-03-31 | Disposition: A | Discharge status: Home / Self Care | Source: Ambulatory Visit | Attending: Certified Nurse Midwife | Admitting: Certified Nurse Midwife

## 2024-03-31 ENCOUNTER — Ambulatory Visit (INDEPENDENT_AMBULATORY_CARE_PROVIDER_SITE_OTHER): Payer: Self-pay | Admitting: Certified Nurse Midwife

## 2024-03-31 ENCOUNTER — Encounter: Payer: Self-pay | Admitting: Certified Nurse Midwife

## 2024-03-31 VITALS — BP 94/66 | HR 82 | Wt 122.4 lb

## 2024-03-31 DIAGNOSIS — Z3481 Encounter for supervision of other normal pregnancy, first trimester: Secondary | ICD-10-CM

## 2024-03-31 DIAGNOSIS — Z113 Encounter for screening for infections with a predominantly sexual mode of transmission: Secondary | ICD-10-CM | POA: Insufficient documentation

## 2024-03-31 DIAGNOSIS — Z1379 Encounter for other screening for genetic and chromosomal anomalies: Secondary | ICD-10-CM

## 2024-03-31 DIAGNOSIS — Z124 Encounter for screening for malignant neoplasm of cervix: Secondary | ICD-10-CM | POA: Insufficient documentation

## 2024-03-31 DIAGNOSIS — T7589XA Other specified effects of external causes, initial encounter: Secondary | ICD-10-CM

## 2024-03-31 DIAGNOSIS — Z3A12 12 weeks gestation of pregnancy: Secondary | ICD-10-CM | POA: Diagnosis not present

## 2024-03-31 DIAGNOSIS — Z0184 Encounter for antibody response examination: Secondary | ICD-10-CM

## 2024-03-31 DIAGNOSIS — Z130 Encounter for screening for diseases of the blood and blood-forming organs and certain disorders involving the immune mechanism: Secondary | ICD-10-CM

## 2024-03-31 DIAGNOSIS — Z8619 Personal history of other infectious and parasitic diseases: Secondary | ICD-10-CM

## 2024-03-31 DIAGNOSIS — Z0283 Encounter for blood-alcohol and blood-drug test: Secondary | ICD-10-CM

## 2024-03-31 LAB — OB RESULTS CONSOLE VARICELLA ZOSTER ANTIBODY, IGG: Varicella: IMMUNE

## 2024-03-31 MED ORDER — NICOTINE 14 MG/24HR TD PT24: MEDICATED_PATCH | TRANSDERMAL | 0 refills | Status: DC

## 2024-03-31 NOTE — Patient Instructions (Signed)
 Prenatal Care Prenatal care is health care you get when pregnant. It helps you and your unborn baby stay as healthy as possible. Start prenatal care early in your pregnancy and continue to go to visits during your pregnancy. Prenatal care may be given by a midwife, a family practice doctor, a Publishing rights manager, physician assistant, or a childbirth and pregnancy doctor. What are the benefits of prenatal care? In prenatal care, your health care provider will get to know your medical history. You'll be checked for conditions that might affect you and your baby. Prenatal care will: Lower the risk for problems as your child grows. Lower certain risks for your baby, especially the risk that: Your child may be born early. Your child will have a low weight at birth. What can I expect at the first prenatal care visit? Your first visit will likely be the longest. You should ask to be seen as soon as you know you're pregnant. The first visit is a good time to talk about any questions or concerns. Make a list of questions to ask your provider at your visits. Medical history At your visit, you and your provider will talk about your medical history, including: Your family's medical history and the medical history of the baby's father. Any past pregnancies and long-term (chronic) health conditions. Any surgeries or procedures you have had. All medicines you're taking. Tell them if you're taking herbs or supplements too. Any tobacco, alcohol, or drug use. Other problems that may harm you and your baby. Tell them if: You need food or housing. You have been around chemicals or radiation. Your partner yells at you, hits you, or hurts you. Tests and screenings Your provider will: Do a physical exam, including a pelvic and breast exam. Do tests to check for: Urinary tract infection (UTI). Sexually transmitted infections (STIs). Low iron levels in your blood. This is called anemia. Blood type and certain  proteins on red blood cells called Rh antibodies. Infections and immunity to viruses, such as hepatitis B and rubella. HIV. Ask your provider if you need to be checked for genetic diseases. Tips about staying healthy Your provider will also give you information about how to keep yourself and your baby healthy, including: Nutrition, vitamins, and food safety. Physical activity. How to treat some problems, such as morning sickness. How to avoid infections and substances that may harm your baby. Caring for your teeth. Work and travel. Problems that require you to call your provider. How often will I have prenatal care visits? After your first prenatal care visit, you will have regular visits throughout your pregnancy. You may visit your provider as follows: Up to week 28 of pregnancy: once every 4 weeks. 28-36 weeks: once every 2 weeks. After 36 weeks: every week until delivery. Some people may have more visits. Others may have fewer. It all depends on your health and that of your baby. Keep all prenatal visits. This is one way for you and your baby to stay as healthy as possible. What happens during routine prenatal care visits? Your provider will: Check your weight and blood pressure. Check your baby's heart sounds. Ask questions about your diet, exercise, sleeping patterns, and whether you can feel the baby move. Ask about any pregnancy symptoms you're having and how you're dealing with them. Tell your provider if: You throw up or feel like you may throw up. You have discharge or you bleed from your vagina. You have trouble pooping (constipation). You have swelling, headaches, or trouble  seeing. You are very tired, or you feel sad and anxious all the time. You have discomfort, including back pain or pain in the pelvis. Tell you problems to watch for during your pregnancy, including signs of labor. Measure the height of your uterus in your belly. This is called fundal height. What  tests might I have during prenatal care visits? You may have blood, urine, and imaging tests. These may include: Urine tests to check for blood sugar, protein, or signs of infection. Genetic testing. Ultrasounds to check your baby's growth, development, and well-being. Your baby may also be checked for congenital conditions. Glucose tests to check for gestational diabetes. This is a form of diabetes that a person can get when pregnant. A test to check for group B strep (GBS) infection. What else can I expect during prenatal care visits? Your provider may give you some vaccines. Getting certain vaccines during pregnancy can protect your baby after birth. These may include: A flu shot. Tdap (tetanus, diphtheria, pertussis) vaccine. A COVID-19 vaccine. A RSV vaccine. Later in your pregnancy, your provider may talk to you about: Childbirth and childbirth classes. Breastfeeding and breastfeeding classes. Birth control after your baby is born. Where to find more information Office on Women's Health: TravelLesson.ca American Pregnancy Association: americanpregnancy.org March of Dimes: marchofdimes.org This information is not intended to replace advice given to you by your health care provider. Make sure you discuss any questions you have with your health care provider. Document Revised: 04/02/2023 Document Reviewed: 04/02/2023 Elsevier Patient Education  2024 [employer].

## 2024-03-31 NOTE — Addendum Note (Signed)
 Addended by: Venetta Gill on: 03/31/2024 11:10 AM   Modules accepted: Orders

## 2024-03-31 NOTE — Progress Notes (Signed)
 NEW OB HISTORY AND PHYSICAL  SUBJECTIVE:       Cynthia Davies is a 38 y.o. [phone] female, Patient's last menstrual period was 01/04/2024 (exact date)., Estimated Date of Delivery: 10/10/24, [redacted]w[redacted]d, presents today for establishment of Prenatal Care. She has no unusual complaints   Of note pt has history c section x 2. 1st for fetal distress, 2nd was repeat. Undecided about mode of delivery for this pregnancy. 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 Is interested in stopping.    Gynecologic History Patient's last menstrual period was 01/04/2024 (exact date). Normal Contraception: none Last Pap: 11/22/2018. Results were: ASCUS /HPV-  Obstetric History OB History  Gravida Para Term Preterm AB Living  5 2 2  0 2 2  SAB IAB Ectopic Multiple Live Births  1 1 0 0 2    # Outcome Date GA Lbr Len/2nd Weight Sex Type Anes PTL Lv  5 Current           4 IAB 05/2016          3 Term 03/03/16    M CS-LTranv   LIV  2 SAB 03/2011 [redacted]w[redacted]d   M    FD  1 Term 05/22/07 [redacted]w[redacted]d  5 lb 2 oz (2.325 kg) F CS-LTranv   LIV    Obstetric Comments  2012 pregnancy was a 15 week demise; delivery augmented with medication  2008 pregnancy was induced for FGR; c/s for failure to progress; female, 5#2oz.    Past Medical History:  Diagnosis Date   Anemia    History of stillbirth 08/31/2015   Medical history non-contributory    Syphilis 2024   Vaginal Pap smear, abnormal     Past Surgical History:  Procedure Laterality Date   CESAREAN SECTION  12/11/2006   CESAREAN SECTION     WISDOM TOOTH EXTRACTION      Current Outpatient Medications on File Prior to Visit  Medication Sig Dispense Refill   Prenatal Vit-Fe Fumarate-FA (MULTIVITAMIN-PRENATAL) 27-0.8 MG TABS tablet Take 1 tablet by  mouth daily at 12 noon.     No current facility-administered medications on file prior to visit.    No Known Allergies  Social History   Socioeconomic History   Marital status: Significant Other    Spouse name: Not on file   Number of children: 2   Years of education: Not on file   Highest education level: GED or equivalent  Occupational History   Occupation: Multimedia programmer: [employer]  Tobacco Use   Smoking status: Every Day    Current packs/day: 0.10    Average packs/day: 0.1 packs/day for 12.0 years (1.2 ttl pk-yrs)    Types: Cigarettes, E-cigarettes, Cigars   Smokeless tobacco: Never   Tobacco comments:    Pt states that she is not smoking cigars or E-cigarettes  Vaping Use   Vaping status: Never Used  Substance and Sexual Activity   Alcohol use: Not Currently    Alcohol/week: 1.0 standard drink of alcohol    Types: 1 Cans of beer per week    Comment: last ETOH 01/23/2024   Drug use: Never   Sexual activity: Not Currently    Partners: Male    Birth control/protection: None  Other Topics Concern   Not on file  Social History Narrative   Not on file   Social Drivers of Health   Financial Resource Strain: High Risk (02/22/2024)   Overall Financial Resource Strain (CARDIA)    Difficulty of Paying Living Expenses: Very hard  Food Insecurity: Food Insecurity Present (02/22/2024)   Hunger Vital Sign    Worried About Running Out of Food in the Last Year: Never true    Ran Out of Food in the Last Year: Sometimes true  Transportation Needs: Unmet Transportation Needs (02/22/2024)   PRAPARE - Administrator, Civil Service (Medical): No    Lack of Transportation (Non-Medical): Yes  Physical Activity: Insufficiently Active (02/22/2024)   Exercise Vital Sign    Days of Exercise per Week: 7 days    Minutes of Exercise per Session: 10 min  Stress: No Stress Concern Present (02/22/2024)   [employer] of Occupational Health - Occupational Stress Questionnaire     Feeling of Stress : Not at all  Social Connections: Moderately Integrated (02/22/2024)   Social Connection and Isolation Panel [NHANES]    Frequency of Communication with Friends and Family: More than three times a week    Frequency of Social Gatherings with Friends and Family: Never    Attends Religious Services: 1 to 4 times per year    Active Member of [employer] or Organizations: No    Attends Banker Meetings: Never    Marital Status: Living with partner  Intimate Partner Violence: Not At Risk (02/22/2024)   Humiliation, Afraid, Rape, and Kick questionnaire    Fear of Current or Ex-Partner: No    Emotionally Abused: No    Physically Abused: No    Sexually Abused: No    Family History  Problem Relation Age of Onset   Diabetes Paternal Grandmother    Diabetes Maternal Grandmother    Heart disease Maternal Grandmother    Diabetes Maternal Grandfather    Diabetes Mother    Hypertension Mother    Lung cancer Mother    Hypertension Brother    Diabetes Sister    Hypertension Sister    Breast cancer Neg Hx     The following portions of the patient's history were reviewed and updated as appropriate: allergies, current medications, past OB history, past medical history, past surgical history, past family history, past social history, and problem list.    OBJECTIVE: Initial Physical Exam (New OB)  GENERAL APPEARANCE: alert, well appearing, in no apparent distress, oriented to person, place and time HEAD: normocephalic, atraumatic MOUTH: mucous membranes moist, pharynx normal without lesions THYROID: no thyromegaly or masses present BREASTS: no masses noted, no significant tenderness, no palpable axillary nodes, no skin changes LUNGS: clear to auscultation, no wheezes, rales or rhonchi, symmetric air entry HEART: regular rate and rhythm, no murmurs ABDOMEN: soft, nontender, nondistended, no abnormal masses, no epigastric pain and FHT present EXTREMITIES: no redness or  tenderness in the calves or thighs SKIN: normal coloration and turgor, no rashes LYMPH NODES: no adenopathy palpable NEUROLOGIC: alert, oriented, normal speech, no focal findings or movement disorder noted  PELVIC EXAM EXTERNAL GENITALIA: normal appearing vulva with no masses, tenderness or lesions VAGINA: no abnormal discharge or lesions CERVIX: no lesions or cervical motion tenderness UTERUS: gravid ADNEXA: no masses palpable and nontender OB EXAM PELVIMETRY: appears adequate RECTUM: exam not indicated  ASSESSMENT: Normal pregnancy  PLAN: Prenatal care See ordersNew OB counseling: The patient has been given an overview regarding routine prenatal care. Recommendations regarding diet, weight gain, and exercise in  pregnancy were given. Prenatal testing, optional genetic testing, and ultrasound use in pregnancy were reviewed. Benefits of Breast Feeding were discussed. The patient is encouraged to consider nursing her baby post partum. She is desiring patch to help her stop smoking. Orders placed. Follow up 4 weeks for ROB.

## 2024-04-01 LAB — URINALYSIS, ROUTINE W REFLEX MICROSCOPIC
Bilirubin, UA: NEGATIVE
Glucose, UA: NEGATIVE
Ketones, UA: NEGATIVE
Nitrite, UA: NEGATIVE
Protein,UA: NEGATIVE
RBC, UA: NEGATIVE
Specific Gravity, UA: <=1.005 — AB (ref 1.005–1.030)
Urobilinogen, Ur: 0.2 mg/dL (ref 0.2–1.0)
pH, UA: 7 (ref 5.0–7.5)

## 2024-04-01 LAB — MICROSCOPIC EXAMINATION
Bacteria, UA: NONE SEEN
Casts: NONE SEEN /LPF
RBC, Urine: NONE SEEN /HPF (ref 0–2)

## 2024-04-01 LAB — CERVICOVAGINAL ANCILLARY ONLY
Chlamydia: NEGATIVE
Comment: NEGATIVE
Comment: NORMAL
Neisseria Gonorrhea: NEGATIVE

## 2024-04-02 ENCOUNTER — Other Ambulatory Visit

## 2024-04-02 LAB — CBC/D/PLT+RPR+RH+ABO+RUBIGG...
Antibody Screen: NEGATIVE
Basophils Absolute: 0 10*3/uL (ref 0.0–0.2)
Basos: 0 %
EOS (ABSOLUTE): 0 10*3/uL (ref 0.0–0.4)
Eos: 1 %
HCV Ab: NONREACTIVE
HIV Screen 4th Generation wRfx: NONREACTIVE
Hematocrit: 33.9 % — ABNORMAL LOW (ref 34.0–46.6)
Hemoglobin: 11.1 g/dL (ref 11.1–15.9)
Hepatitis B Surface Ag: NEGATIVE
Immature Grans (Abs): 0 10*3/uL (ref 0.0–0.1)
Immature Granulocytes: 0 %
Lymphocytes Absolute: 1.6 10*3/uL (ref 0.7–3.1)
Lymphs: 22 %
MCH: 29.4 pg (ref 26.6–33.0)
MCHC: 32.7 g/dL (ref 31.5–35.7)
MCV: 90 fL (ref 79–97)
Monocytes Absolute: 0.3 10*3/uL (ref 0.1–0.9)
Monocytes: 4 %
Neutrophils Absolute: 5.5 10*3/uL (ref 1.4–7.0)
Neutrophils: 73 %
Platelets: 222 10*3/uL (ref 150–450)
RBC: 3.77 x10E6/uL (ref 3.77–5.28)
RDW: 12.3 % (ref 11.7–15.4)
RPR Ser Ql: NONREACTIVE
Rh Factor: POSITIVE
Rubella Antibodies, IGG: 5.22 {index} (ref >0.99)
Varicella zoster IgG: REACTIVE
WBC: 7.5 10*3/uL (ref 3.4–10.8)

## 2024-04-02 LAB — MONITOR DRUG PROFILE 14(MW)
Amphetamine Scrn, Ur: NEGATIVE ng/mL
BARBITURATE SCREEN URINE: NEGATIVE ng/mL
BENZODIAZEPINE SCREEN, URINE: NEGATIVE ng/mL
Buprenorphine, Urine: NEGATIVE ng/mL
CANNABINOIDS UR QL SCN: NEGATIVE ng/mL
Cocaine (Metab) Scrn, Ur: NEGATIVE ng/mL
Creatinine(Crt), U: 10.5 mg/dL — ABNORMAL LOW (ref 20.0–300.0)
Fentanyl, Urine: NEGATIVE pg/mL
Meperidine Screen, Urine: NEGATIVE ng/mL
Methadone Screen, Urine: NEGATIVE ng/mL
OXYCODONE+OXYMORPHONE UR QL SCN: NEGATIVE ng/mL
Opiate Scrn, Ur: NEGATIVE ng/mL
Ph of Urine: 6.7 (ref 4.5–8.9)
Phencyclidine Qn, Ur: NEGATIVE ng/mL
Propoxyphene Scrn, Ur: NEGATIVE ng/mL
SPECIFIC GRAVITY: 1.0017
Tramadol Screen, Urine: NEGATIVE ng/mL

## 2024-04-02 LAB — URINE CULTURE, OB REFLEX

## 2024-04-02 LAB — CULTURE, OB URINE

## 2024-04-02 LAB — T.PALLIDUM AB, TOTAL: Treponema pallidum Antibodies: REACTIVE — AB

## 2024-04-02 LAB — HCV INTERPRETATION

## 2024-04-02 LAB — CYTOLOGY - PAP
Comment: NEGATIVE
Diagnosis: NEGATIVE
High risk HPV: NEGATIVE

## 2024-04-02 LAB — NICOTINE SCREEN, URINE: Cotinine Ql Scrn, Ur: POSITIVE ng/mL — AB

## 2024-04-06 LAB — MATERNIT 21 PLUS CORE, BLOOD
Fetal Fraction: 27
Result (T21): NEGATIVE
Trisomy 13 (Patau syndrome): NEGATIVE
Trisomy 18 (Edwards syndrome): NEGATIVE
Trisomy 21 (Down syndrome): NEGATIVE

## 2024-04-07 ENCOUNTER — Encounter: Payer: Self-pay | Admitting: Intensive Care

## 2024-04-07 ENCOUNTER — Other Ambulatory Visit: Payer: Self-pay

## 2024-04-07 ENCOUNTER — Emergency Department
Admission: EM | Admit: 2024-04-07 | Discharge: 2024-04-07 | Disposition: A | Discharge status: Home / Self Care | Attending: Emergency Medicine | Admitting: Emergency Medicine

## 2024-04-07 DIAGNOSIS — B9689 Other specified bacterial agents as the cause of diseases classified elsewhere: Secondary | ICD-10-CM | POA: Insufficient documentation

## 2024-04-07 DIAGNOSIS — O26891 Other specified pregnancy related conditions, first trimester: Secondary | ICD-10-CM | POA: Diagnosis not present

## 2024-04-07 DIAGNOSIS — F17200 Nicotine dependence, unspecified, uncomplicated: Secondary | ICD-10-CM | POA: Diagnosis not present

## 2024-04-07 DIAGNOSIS — Z3A13 13 weeks gestation of pregnancy: Secondary | ICD-10-CM | POA: Diagnosis not present

## 2024-04-07 DIAGNOSIS — O2342 Unspecified infection of urinary tract in pregnancy, second trimester: Secondary | ICD-10-CM | POA: Diagnosis not present

## 2024-04-07 DIAGNOSIS — N390 Urinary tract infection, site not specified: Secondary | ICD-10-CM | POA: Insufficient documentation

## 2024-04-07 DIAGNOSIS — R519 Headache, unspecified: Secondary | ICD-10-CM | POA: Insufficient documentation

## 2024-04-07 LAB — BASIC METABOLIC PANEL WITH GFR
Anion gap: 6 (ref 5–15)
BUN: 6 mg/dL (ref 6–20)
CO2: 23 mmol/L (ref 22–32)
Calcium: 8.3 mg/dL — ABNORMAL LOW (ref 8.9–10.3)
Chloride: 104 mmol/L (ref 98–111)
Creatinine, Ser: 0.64 mg/dL (ref 0.44–1.00)
GFR, Estimated: >60 mL/min (ref >60)
Glucose, Bld: 104 mg/dL — ABNORMAL HIGH (ref 70–99)
Potassium: 3.9 mmol/L (ref 3.5–5.1)
Sodium: 133 mmol/L — ABNORMAL LOW (ref 135–145)

## 2024-04-07 LAB — URINALYSIS, ROUTINE W REFLEX MICROSCOPIC
Bilirubin Urine: NEGATIVE
Glucose, UA: NEGATIVE mg/dL
Hgb urine dipstick: NEGATIVE
Ketones, ur: NEGATIVE mg/dL
Nitrite: NEGATIVE
Protein, ur: NEGATIVE mg/dL
Specific Gravity, Urine: 1.009 (ref 1.005–1.030)
pH: 7 (ref 5.0–8.0)

## 2024-04-07 LAB — CBC WITH DIFFERENTIAL/PLATELET
Abs Immature Granulocytes: 0.01 10*3/uL (ref 0.00–0.07)
Basophils Absolute: 0 10*3/uL (ref 0.0–0.1)
Basophils Relative: 0 %
Eosinophils Absolute: 0 10*3/uL (ref 0.0–0.5)
Eosinophils Relative: 0 %
HCT: 33 % — ABNORMAL LOW (ref 36.0–46.0)
Hemoglobin: 10.9 g/dL — ABNORMAL LOW (ref 12.0–15.0)
Immature Granulocytes: 0 %
Lymphocytes Relative: 22 %
Lymphs Abs: 1.4 10*3/uL (ref 0.7–4.0)
MCH: 30.1 pg (ref 26.0–34.0)
MCHC: 33 g/dL (ref 30.0–36.0)
MCV: 91.2 fL (ref 80.0–100.0)
Monocytes Absolute: 0.2 10*3/uL (ref 0.1–1.0)
Monocytes Relative: 3 %
Neutro Abs: 4.8 10*3/uL (ref 1.7–7.7)
Neutrophils Relative %: 75 %
Platelets: 201 10*3/uL (ref 150–400)
RBC: 3.62 MIL/uL — ABNORMAL LOW (ref 3.87–5.11)
RDW: 13 % (ref 11.5–15.5)
WBC: 6.5 10*3/uL (ref 4.0–10.5)
nRBC: 0 % (ref 0.0–0.2)

## 2024-04-07 MED ORDER — CEPHALEXIN 500 MG PO CAPS: 500.0000 mg | ORAL_CAPSULE | Freq: Three times a day (TID) | ORAL | 0 refills | Status: DC

## 2024-04-07 NOTE — ED Provider Notes (Signed)
 [facility] Provider Note    Event Date/Time   First MD Initiated Contact with Patient 04/07/24 1029     (approximate)   History   Headache   HPI  Cynthia Davies is a 38 y.o. female   presents to the ED with complaint of headache that began last week.  Patient states that she has taken over-the-counter medication, Benadryl  and Tylenol  without any relief.  Patient reports that she is approximately [redacted] weeks pregnant.  She went to [facility] and had blood work done which was normal.  Patient denies any upper respiratory symptoms, sick exposures, cough or sore throat.  Patient is a smoker, has a history of anemia and in the past had had urinary tract infections.     Physical Exam   Triage Vital Signs: ED Triage Vitals  Encounter Vitals Group     BP 04/07/24 1014 99/61     Systolic BP Percentile --      Diastolic BP Percentile --      Pulse Rate 04/07/24 1014 85     Resp 04/07/24 1014 16     Temp 04/07/24 1014 98.6 F (37 C)     Temp Source 04/07/24 1014 Oral     SpO2 04/07/24 1014 100 %     Weight 04/07/24 1016 122 lb (55.3 kg)     Height 04/07/24 1016 5\' 5"  (1.651 m)     Head Circumference --      Peak Flow --      Pain Score 04/07/24 1016 7     Pain Loc --      Pain Education --      Exclude from Growth Chart --     Most recent vital signs: Vitals:   04/07/24 1014 04/07/24 1515  BP: 99/61 94/61  Pulse: 85 74  Resp: 16 18  Temp: 98.6 F (37 C)   SpO2: 100% 100%     General: Awake, no distress.  Alert, cooperative, able to answer questions appropriately. CV:  Good peripheral perfusion.  Heart regular rate and rhythm. Resp:  Normal effort.  Lungs are clear bilaterally. Abd:  No distention.  Other:  PERRLA, EOMI's, cranial nerves II through XII grossly intact.  Speech is normal.  Patient is able to stand and ambulate without any assistance.   ED Results / Procedures / Treatments   Labs (all labs ordered are listed, but only  abnormal results are displayed) Labs Reviewed  CBC WITH DIFFERENTIAL/PLATELET - Abnormal; Notable for the following components:      Result Value   RBC 3.62 (*)    Hemoglobin 10.9 (*)    HCT 33.0 (*)    All other components within normal limits  BASIC METABOLIC PANEL WITH GFR - Abnormal; Notable for the following components:   Sodium 133 (*)    Glucose, Bld 104 (*)    Calcium 8.3 (*)    All other components within normal limits  URINALYSIS, ROUTINE W REFLEX MICROSCOPIC - Abnormal; Notable for the following components:   Color, Urine YELLOW (*)    APPearance HAZY (*)    Leukocytes,Ua LARGE (*)    Bacteria, UA MANY (*)    All other components within normal limits  URINE CULTURE      PROCEDURES:  Critical Care performed:   Procedures   MEDICATIONS ORDERED IN ED: Medications - No data to display   IMPRESSION / MDM / ASSESSMENT AND PLAN / ED COURSE  I reviewed the triage vital signs  and the nursing notes.   Differential diagnosis includes, but is not limited to, headache, sinusitis, viral infection, urinary tract infection.  38 year old female who is [redacted] weeks pregnant presents to the ED with complaint of headache.  She reports that she took Tylenol  and Benadryl  without relief of her headache.  She also has a history of urinary tract infections.  She reports that she was seen at [facility] where her labs were reported as normal.  Patient was reluctant to have lab work or a repeat urinalysis done.  I explained to her that this was 7 days ago and things could change.  Blood work was reassuring however urinalysis did show a urinary tract infection with 21-50 WBCs and many bacteria.  A urine culture was ordered.  Patient was placed on cephalexin 500 mg 3 times daily for 7 days.  She strongly encouraged to call and make an appointment with her OB/GYN to have her urine rechecked after finishing the antibiotic.  She is also encouraged to drink lots of fluids to stay hydrated and made  aware that she could take Tylenol  as needed for her headache or bodyaches.  She is to return to the emergency department if any severe worsening of her symptoms or urgent concerns.       Patient's presentation is most consistent with acute illness / injury with system symptoms.  FINAL CLINICAL IMPRESSION(S) / ED DIAGNOSES   Final diagnoses:  Acute urinary tract infection     Rx / DC Orders   ED Discharge Orders          Ordered    cephALEXin (KEFLEX) 500 MG capsule  3 times daily        04/07/24 1525             Note:  This document was prepared using Dragon voice recognition software and may include unintentional dictation errors.   Stafford Eagles, PA-C 04/07/24 1541    Claria Crofts, MD 04/08/24 [phone]

## 2024-04-07 NOTE — ED Triage Notes (Signed)
 Patient reports she is [redacted] weeks pregnant. The last week she has had a headache with no relief from OTC meds. Has tried benadryl  and tylenol .  Denies emesis this past week. Reports some nausea.   Patient refused blood work in triage until speaking with provider

## 2024-04-07 NOTE — Discharge Instructions (Addendum)
 Call make an appointment with your OB/GYN at [facility] if any continued problems and also schedule appointment to have your urine rechecked after finishing the antibiotic.  This was sent to your pharmacy to take for the next 7 days.  Increase fluids to stay hydrated which will also cause you to urinate frequently.  You may take Tylenol  safely as needed for your headache however it is the infection that is causing your headache.  If any severe worsening of your symptoms such as fever, back pain, nausea or vomiting return to the emergency department.

## 2024-04-07 NOTE — ED Notes (Signed)
 Pt verbalizes understanding of discharge instructions. Opportunity for questioning and answers were provided. Pt discharged from ED to home.   ? ?

## 2024-04-08 LAB — URINE CULTURE: Culture: 50000 — AB

## 2024-04-28 ENCOUNTER — Ambulatory Visit (INDEPENDENT_AMBULATORY_CARE_PROVIDER_SITE_OTHER)

## 2024-04-28 VITALS — BP 96/64 | HR 96 | Wt 122.4 lb

## 2024-04-28 DIAGNOSIS — Z3689 Encounter for other specified antenatal screening: Secondary | ICD-10-CM

## 2024-04-28 DIAGNOSIS — Z3A16 16 weeks gestation of pregnancy: Secondary | ICD-10-CM

## 2024-04-28 DIAGNOSIS — Z98891 History of uterine scar from previous surgery: Secondary | ICD-10-CM | POA: Insufficient documentation

## 2024-04-28 DIAGNOSIS — Z3480 Encounter for supervision of other normal pregnancy, unspecified trimester: Secondary | ICD-10-CM

## 2024-04-28 DIAGNOSIS — A539 Syphilis, unspecified: Secondary | ICD-10-CM

## 2024-04-28 DIAGNOSIS — Z8619 Personal history of other infectious and parasitic diseases: Secondary | ICD-10-CM | POA: Diagnosis not present

## 2024-04-28 DIAGNOSIS — Z3482 Encounter for supervision of other normal pregnancy, second trimester: Secondary | ICD-10-CM

## 2024-04-28 DIAGNOSIS — R519 Headache, unspecified: Secondary | ICD-10-CM

## 2024-04-28 DIAGNOSIS — Z1379 Encounter for other screening for genetic and chromosomal anomalies: Secondary | ICD-10-CM

## 2024-04-28 MED ORDER — METOCLOPRAMIDE HCL 10 MG PO TABS
10.0000 mg | ORAL_TABLET | Freq: Four times a day (QID) | ORAL | 2 refills | Status: DC | PRN
Start: 2024-04-28 — End: 2024-09-22

## 2024-04-28 NOTE — Progress Notes (Signed)
    Return Prenatal Note   Assessment/Plan   Plan  38 y.o. [id number] at [redacted]w[redacted]d presents for follow-up OB visit. Reviewed prenatal record including previous visit note.  Syphilis - RPR collected today to confirm no current infection.  Supervision of other normal pregnancy, antepartum - Continues to have headaches. Has been taking ibuprofen  as its the only thing that helps. Counseled on not using ibuprofen  in pregnancy. Provided with resources for managing headaches in pregnancy and an Rx for Reglan .  - Completed antibiotics given in the ED for UTI. Will plan for UC TOC at next visit.  - AFP collected today. Will schedule anatomy US  in 4 weeks.  - Reviewed red flag warning signs anticipatory guidance for upcoming prenatal care.    Orders Placed This Encounter  Procedures   US  OB Comp + 14 Wk    Standing Status:   Future    Expected Date:   05/23/2024    Expiration Date:   07/25/2024    Reason for Exam (SYMPTOM  OR DIAGNOSIS REQUIRED):   Fetal Anatomic Survey    Preferred Imaging Location?:   Internal   AFP, Serum, Open Spina Bifida    Is patient insulin dependent?:   No    Weight (lbs):   122    Gestational Age (GA), weeks:   70    Date on which patient was at this GA:   04/25/2024    GA Calculation Method:   LMP    GA Date:   10/10/2024    Number of fetuses:   1    Reason for screen:   OTHER             screen    Donor egg?:   N   RPR w/reflex to TrepSure   Return in about 4 weeks (around 05/26/2024) for ROB.   No future appointments.  For next visit:  anatomy US  and routine prenatal care     Subjective   38 y.o. [id number] at [redacted]w[redacted]d presents for this follow-up prenatal visit.  Patient is experiencing more frequent headaches. Patient reports: Movement: Absent Contractions: Not present  Objective   Flow sheet Vitals: Pulse Rate: 96 BP: 96/64 Fundal Height: 16 cm Fetal Heart Rate (bpm): 145 Total weight gain: 6.4 oz (0.181 kg)  General Appearance  No acute  distress, well appearing, and well nourished Pulmonary   Normal work of breathing Neurologic   Alert and oriented to person, place, and time Psychiatric   Mood and affect within normal limits  Verita Glassman Karem Tomaso, CNM  04/28/2510:00 AM

## 2024-04-28 NOTE — Assessment & Plan Note (Signed)
-   RPR collected today to confirm no current infection.

## 2024-04-28 NOTE — Assessment & Plan Note (Addendum)
-   Continues to have headaches. Has been taking ibuprofen  as its the only thing that helps. Counseled on not using ibuprofen  in pregnancy. Provided with resources for managing headaches in pregnancy and an Rx for Reglan .  - Completed antibiotics given in the ED for UTI. Will plan for UC TOC at next visit.  - AFP collected today. Will schedule anatomy US  in 4 weeks.  - Reviewed red flag warning signs anticipatory guidance for upcoming prenatal care.

## 2024-04-29 LAB — TREPONEMAL ANTIBODIES, TPPA: Treponemal Antibodies, TPPA: REACTIVE — AB

## 2024-04-29 LAB — RPR W/REFLEX TO TREPSURE: RPR: NONREACTIVE

## 2024-04-30 LAB — AFP, SERUM, OPEN SPINA BIFIDA
AFP MoM: 0.94
AFP Value: 41.4 ng/mL
Gest. Age on Collection Date: 16.4 wk
Maternal Age At EDD: 37 a
OSBR Risk 1 IN: 10000
Test Results:: NEGATIVE
Weight: 122 [lb_av]

## 2024-05-01 ENCOUNTER — Ambulatory Visit: Payer: Self-pay

## 2024-05-14 ENCOUNTER — Ambulatory Visit
Admission: RE | Admit: 2024-05-14 | Discharge: 2024-05-14 | Disposition: A | Discharge status: Home / Self Care | Source: Ambulatory Visit

## 2024-05-14 DIAGNOSIS — Z3689 Encounter for other specified antenatal screening: Secondary | ICD-10-CM | POA: Insufficient documentation

## 2024-05-14 DIAGNOSIS — O09512 Supervision of elderly primigravida, second trimester: Secondary | ICD-10-CM | POA: Diagnosis not present

## 2024-05-14 DIAGNOSIS — Z3A19 19 weeks gestation of pregnancy: Secondary | ICD-10-CM | POA: Diagnosis not present

## 2024-05-14 DIAGNOSIS — Z369 Encounter for antenatal screening, unspecified: Secondary | ICD-10-CM | POA: Diagnosis not present

## 2024-05-17 ENCOUNTER — Encounter: Payer: Self-pay | Admitting: Certified Nurse Midwife

## 2024-05-17 DIAGNOSIS — O09299 Supervision of pregnancy with other poor reproductive or obstetric history, unspecified trimester: Secondary | ICD-10-CM | POA: Insufficient documentation

## 2024-05-21 ENCOUNTER — Other Ambulatory Visit: Payer: Self-pay

## 2024-05-21 DIAGNOSIS — Z362 Encounter for other antenatal screening follow-up: Secondary | ICD-10-CM

## 2024-05-21 DIAGNOSIS — Z3480 Encounter for supervision of other normal pregnancy, unspecified trimester: Secondary | ICD-10-CM

## 2024-05-27 ENCOUNTER — Encounter: Payer: Self-pay | Admitting: Licensed Practical Nurse

## 2024-05-27 ENCOUNTER — Ambulatory Visit (INDEPENDENT_AMBULATORY_CARE_PROVIDER_SITE_OTHER): Admitting: Licensed Practical Nurse

## 2024-05-27 VITALS — BP 98/73 | HR 102 | Wt 127.1 lb

## 2024-05-27 DIAGNOSIS — Z3A20 20 weeks gestation of pregnancy: Secondary | ICD-10-CM

## 2024-05-27 DIAGNOSIS — Z3480 Encounter for supervision of other normal pregnancy, unspecified trimester: Secondary | ICD-10-CM | POA: Diagnosis not present

## 2024-05-27 NOTE — Assessment & Plan Note (Signed)
-   Mood has improved, now feeling more excited about the pregnancy.  - Appetite is improved and now is eating more. Denies ever having nausea but reports food aversion. Recommended high protein meals, fruits and veggies. TWG 5 lbs.  - Anatomy ultrasound normal, incomplete views of fetal spine. Follow up ordered. Discussed scheduling that.  - Had questions about when she would need to have birth plan in place. Unsure about repeat c/s vs VBAC. Has had 2 c/s, thinking about possible TOLAC. Encourage to think about it, schedule an appointment with doctors about risk and benefits and let us  know by 32 weeks what her plans are.  - Reviewed PTL and warning signs. - R/O in 4 weeks.

## 2024-05-27 NOTE — Progress Notes (Signed)
    Return Prenatal Note   Subjective   38 y.o. [id number] at [redacted]w[redacted]d presents for this follow-up prenatal visit.  Patient doing well, no concerns.  Patient reports: mood has improved, now feeling more excited. Appetite is better and now eating more.  Movement: Present Contractions: Not present  Objective   Flow sheet Vitals: Pulse Rate: (!) 102 BP: 98/73 Fundal Height: 20 cm Fetal Heart Rate (bpm): 140 Total weight gain: 2.313 kg  General Appearance  No acute distress, well appearing, and well nourished Pulmonary   Normal work of breathing Neurologic   Alert and oriented to person, place, and time Psychiatric   Mood and affect within normal limits  Assessment/Plan   Plan  38 y.o. [id number] at [redacted]w[redacted]d presents for follow-up OB visit. Reviewed prenatal record including previous visit note.  Supervision of other normal pregnancy, antepartum - Mood has improved, now feeling more excited about the pregnancy.  - Appetite is improved and now is eating more. Denies ever having nausea but reports food aversion. Recommended high protein meals, fruits and veggies. TWG 5 lbs.  - Anatomy ultrasound normal, incomplete views of fetal spine. Follow up ordered. Discussed scheduling that.  - Had questions about when she would need to have birth plan in place. Unsure about repeat c/s vs VBAC. Has had 2 c/s, thinking about possible TOLAC. Encourage to think about it, schedule an appointment with doctors about risk and benefits and let us  know by 32 weeks what her plans are.  - Reviewed PTL and warning signs. - R/O in 4 weeks.       No orders of the defined types were placed in this encounter.  Return in about 4 weeks (around 06/24/2024) for ROB.   Future Appointments  Date Time Provider Department Center  06/24/2024 10:55 AM Phylliss Brenner, CNM [facility] None    For next visit:  continue with routine prenatal care     Raina Bunting, Student-MidWife  05/28/2511:41 PM

## 2024-06-09 ENCOUNTER — Ambulatory Visit
Admission: RE | Admit: 2024-06-09 | Discharge: 2024-06-09 | Disposition: A | Discharge status: Home / Self Care | Source: Ambulatory Visit

## 2024-06-09 DIAGNOSIS — Z3A22 22 weeks gestation of pregnancy: Secondary | ICD-10-CM | POA: Insufficient documentation

## 2024-06-09 DIAGNOSIS — Z3482 Encounter for supervision of other normal pregnancy, second trimester: Secondary | ICD-10-CM | POA: Insufficient documentation

## 2024-06-09 DIAGNOSIS — Z3480 Encounter for supervision of other normal pregnancy, unspecified trimester: Secondary | ICD-10-CM | POA: Diagnosis present

## 2024-06-09 DIAGNOSIS — Z362 Encounter for other antenatal screening follow-up: Secondary | ICD-10-CM | POA: Diagnosis not present

## 2024-06-09 DIAGNOSIS — O09512 Supervision of elderly primigravida, second trimester: Secondary | ICD-10-CM | POA: Diagnosis not present

## 2024-06-24 ENCOUNTER — Ambulatory Visit (INDEPENDENT_AMBULATORY_CARE_PROVIDER_SITE_OTHER): Admitting: Obstetrics

## 2024-06-24 VITALS — BP 95/60 | HR 76 | Wt 135.0 lb

## 2024-06-24 DIAGNOSIS — Z3480 Encounter for supervision of other normal pregnancy, unspecified trimester: Secondary | ICD-10-CM | POA: Diagnosis not present

## 2024-06-24 DIAGNOSIS — Z131 Encounter for screening for diabetes mellitus: Secondary | ICD-10-CM | POA: Diagnosis not present

## 2024-06-24 DIAGNOSIS — D649 Anemia, unspecified: Secondary | ICD-10-CM | POA: Diagnosis not present

## 2024-06-24 DIAGNOSIS — Z113 Encounter for screening for infections with a predominantly sexual mode of transmission: Secondary | ICD-10-CM

## 2024-06-24 MED ORDER — NICOTINE 21 MG/24HR TD PT24: 21.0000 mg | MEDICATED_PATCH | Freq: Every day | TRANSDERMAL | 0 refills | Status: DC

## 2024-06-24 NOTE — Assessment & Plan Note (Addendum)
-  Discussed 28-week labs at next visit -Next visit with MD to discuss TOLAC vs CS -Discussed fetal development and anticipatory guidance -Nicoderm step 1 prescribed. Instructed not to smoke while using the patch -Reviewed kick counts and preterm labor warning signs. Instructed to call office or come to hospital with persistent headache, vision changes, regular contractions, leaking of fluid, decreased fetal movement or vaginal bleeding.

## 2024-06-24 NOTE — Progress Notes (Signed)
    Return Prenatal Note   Assessment/Plan   Plan  38 y.o. [id number] at [redacted]w[redacted]d presents for follow-up OB visit. Reviewed prenatal record including previous visit note.  Supervision of other normal pregnancy, antepartum -Discussed 28-week labs at next visit -Next visit with MD to discuss TOLAC vs CS -Discussed fetal development and anticipatory guidance -Nicoderm step 1 prescribed. Instructed not to smoke while using the patch -Reviewed kick counts and preterm labor warning signs. Instructed to call office or come to hospital with persistent headache, vision changes, regular contractions, leaking of fluid, decreased fetal movement or vaginal bleeding.      Orders Placed This Encounter  Procedures   28 Week RH+Panel    Standing Status:   Future    Expected Date:   07/08/2024    Expiration Date:   06/24/2025   Return in about 4 weeks (around 07/22/2024).   Future Appointments  Date Time Provider Department Center  07/22/2024  9:20 AM [facility] None  07/22/2024 10:15 AM Leigh Sober, MD [facility] None    For next visit:  ROB with 28-week labs and TDaP    Subjective   Simaya is feeling tired today. She has been trying to use her nicotine  patch, but is still smoking a lot. She feels she needs a stronger patch.  Movement: Present Contractions: Not present  Objective   Flow sheet Vitals: Pulse Rate: 76 BP: 95/60 Fundal Height: 24 cm Fetal Heart Rate (bpm): 140 Total weight gain: 13 lb (5.897 kg)  General Appearance  No acute distress, well appearing, and well nourished Pulmonary   Normal work of breathing Neurologic   Alert and oriented to person, place, and time Psychiatric   Mood and affect within normal limits  Eleanor Canny, CNM 06/24/24 2:22 PM

## 2024-07-22 ENCOUNTER — Encounter: Payer: Self-pay | Admitting: Obstetrics

## 2024-07-22 ENCOUNTER — Other Ambulatory Visit

## 2024-07-22 ENCOUNTER — Ambulatory Visit: Admitting: Obstetrics

## 2024-07-22 ENCOUNTER — Other Ambulatory Visit (HOSPITAL_COMMUNITY)
Admission: RE | Admit: 2024-07-22 | Discharge: 2024-07-22 | Disposition: A | Discharge status: Home / Self Care | Source: Ambulatory Visit | Attending: Obstetrics | Admitting: Obstetrics

## 2024-07-22 VITALS — BP 116/66 | HR 79 | Wt 141.4 lb

## 2024-07-22 DIAGNOSIS — Z23 Encounter for immunization: Secondary | ICD-10-CM

## 2024-07-22 DIAGNOSIS — O09893 Supervision of other high risk pregnancies, third trimester: Secondary | ICD-10-CM

## 2024-07-22 DIAGNOSIS — Z3480 Encounter for supervision of other normal pregnancy, unspecified trimester: Secondary | ICD-10-CM

## 2024-07-22 DIAGNOSIS — F17210 Nicotine dependence, cigarettes, uncomplicated: Secondary | ICD-10-CM

## 2024-07-22 DIAGNOSIS — N898 Other specified noninflammatory disorders of vagina: Secondary | ICD-10-CM | POA: Diagnosis not present

## 2024-07-22 DIAGNOSIS — Z8619 Personal history of other infectious and parasitic diseases: Secondary | ICD-10-CM

## 2024-07-22 DIAGNOSIS — O34219 Maternal care for unspecified type scar from previous cesarean delivery: Secondary | ICD-10-CM | POA: Diagnosis not present

## 2024-07-22 DIAGNOSIS — Z98891 History of uterine scar from previous surgery: Secondary | ICD-10-CM

## 2024-07-22 DIAGNOSIS — O99891 Other specified diseases and conditions complicating pregnancy: Secondary | ICD-10-CM | POA: Diagnosis not present

## 2024-07-22 DIAGNOSIS — Z131 Encounter for screening for diabetes mellitus: Secondary | ICD-10-CM

## 2024-07-22 DIAGNOSIS — D649 Anemia, unspecified: Secondary | ICD-10-CM | POA: Diagnosis not present

## 2024-07-22 DIAGNOSIS — O99333 Smoking (tobacco) complicating pregnancy, third trimester: Secondary | ICD-10-CM

## 2024-07-22 DIAGNOSIS — Z3A28 28 weeks gestation of pregnancy: Secondary | ICD-10-CM | POA: Diagnosis not present

## 2024-07-22 DIAGNOSIS — Z113 Encounter for screening for infections with a predominantly sexual mode of transmission: Secondary | ICD-10-CM | POA: Diagnosis not present

## 2024-07-22 MED ORDER — NICOTINE 21 MG/24HR TD PT24: 21.0000 mg | MEDICATED_PATCH | Freq: Every day | TRANSDERMAL | 11 refills | Status: AC

## 2024-07-22 NOTE — Progress Notes (Signed)
    Return Prenatal Note   Subjective  38 y.o. [id number] at [redacted]w[redacted]d presents for this follow-up prenatal visit.   Patient wanting to discuss c-section vs TOLAC today. Doing 28wk labs and needing repeat TPA for hx of syphilis in 2024 s/p treatment and NR-RPR. Also with some vaginal itching today, requesting swab. 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   Patient reports: Movement: Present Contractions: Not present Denies vaginal bleeding or leaking fluid. Objective  Flow sheet Vitals: Pulse Rate: 79 BP: 116/66 Total weight gain: 19 lb 6.4 oz (8.8 kg)  General Appearance  No acute distress, well appearing, and well nourished Pulmonary   Normal work of breathing Neurologic   Alert and oriented to person, place, and time Psychiatric   Mood and affect within normal limits   Assessment/Plan   Plan  38 y.o. [id number] at [redacted]w[redacted]d by LMP=11wk US  presents for follow-up OB visit. Reviewed prenatal record including previous visit note.  1. Supervision of other normal pregnancy, antepartum (Primary) -BTC, Tdap, 28wk labs completed today -1hGTT done in office -3rd trimester anticipatory guidance given; discussed work request and that we cannot approve complete leave from work unless we have a medical reason; can discuss with her HR dept to modify her job requirements and happy to sign off of light duty or other modifications to help her comfort level.  2. Hx of prior CD x 2 -VBAC calculator 71.2% success -Counseled regarding TOLAC vs RCD; risks/benefits discussed in detail. All questions answered.  Patient elects for repeat CD, consent signed 07/22/2024.  -She would like to have next ROB together to sit on her decision and discuss further.   2. History of syphilis -Recheck TPA today  3. Tobacco use:  -Wants to quit, but not ready; open to trying the patches -Refilled patches  today, 21mg  daily -Cessation encouraged  3. Vaginal itching -Vaginal swab done   Orders Placed This Encounter  Procedures   US  OB Follow Up    Standing Status:   Future    Expected Date:   08/05/2024    Expiration Date:   07/22/2025    Reason for exam::   Growth for tobacco use and hx of IUGR    Preferred imaging location?:   Internal   Tdap vaccine greater than or equal to 7yo IM   T.pallidum Ab, Total   Return in about 2 weeks (around 08/05/2024) for ROB w/Dr. Leigh and growth US .    For next visit:  continue with routine prenatal care    Estil Leigh, DO [facility]

## 2024-07-23 ENCOUNTER — Other Ambulatory Visit: Payer: Self-pay

## 2024-07-23 ENCOUNTER — Ambulatory Visit: Payer: Self-pay

## 2024-07-23 DIAGNOSIS — A599 Trichomoniasis, unspecified: Secondary | ICD-10-CM

## 2024-07-23 DIAGNOSIS — B379 Candidiasis, unspecified: Secondary | ICD-10-CM

## 2024-07-23 LAB — CERVICOVAGINAL ANCILLARY ONLY
Bacterial Vaginitis (gardnerella): NEGATIVE
Candida Glabrata: NEGATIVE
Candida Vaginitis: POSITIVE — AB
Chlamydia: NEGATIVE
Comment: NEGATIVE
Comment: NEGATIVE
Comment: NEGATIVE
Comment: NEGATIVE
Comment: NEGATIVE
Comment: NORMAL
Neisseria Gonorrhea: NEGATIVE
Trichomonas: POSITIVE — AB

## 2024-07-23 MED ORDER — FLUCONAZOLE 150 MG PO TABS: 150.0000 mg | ORAL_TABLET | Freq: Every day | ORAL | 0 refills | Status: DC

## 2024-07-23 MED ORDER — METRONIDAZOLE 500 MG PO TABS: 500.0000 mg | ORAL_TABLET | Freq: Two times a day (BID) | ORAL | 0 refills | Status: DC

## 2024-07-24 LAB — 28 WEEK RH+PANEL
Basophils Absolute: 0 x10E3/uL (ref 0.0–0.2)
Basos: 0 %
EOS (ABSOLUTE): 0.1 x10E3/uL (ref 0.0–0.4)
Eos: 1 %
Gestational Diabetes Screen: 109 mg/dL (ref 70–139)
HIV Screen 4th Generation wRfx: NONREACTIVE
Hematocrit: 29.9 % — ABNORMAL LOW (ref 34.0–46.6)
Hemoglobin: 10 g/dL — ABNORMAL LOW (ref 11.1–15.9)
Immature Grans (Abs): 0 x10E3/uL (ref 0.0–0.1)
Immature Granulocytes: 0 %
Lymphocytes Absolute: 1.9 x10E3/uL (ref 0.7–3.1)
Lymphs: 27 %
MCH: 31.2 pg (ref 26.6–33.0)
MCHC: 33.4 g/dL (ref 31.5–35.7)
MCV: 93 fL (ref 79–97)
Monocytes Absolute: 0.2 x10E3/uL (ref 0.1–0.9)
Monocytes: 3 %
Neutrophils Absolute: 4.8 x10E3/uL (ref 1.4–7.0)
Neutrophils: 69 %
Platelets: 213 x10E3/uL (ref 150–450)
RBC: 3.21 x10E6/uL — ABNORMAL LOW (ref 3.77–5.28)
RDW: 11.9 % (ref 11.7–15.4)
RPR Ser Ql: REACTIVE — AB
WBC: 7 x10E3/uL (ref 3.4–10.8)

## 2024-07-24 LAB — RPR, QUANT+TP ABS (REFLEX)
Rapid Plasma Reagin, Quant: 1:1 {titer} — ABNORMAL HIGH
T Pallidum Abs: REACTIVE — AB

## 2024-07-25 ENCOUNTER — Other Ambulatory Visit: Payer: Self-pay

## 2024-07-25 ENCOUNTER — Emergency Department
Admission: EM | Admit: 2024-07-25 | Discharge: 2024-07-25 | Disposition: A | Discharge status: Home / Self Care | Attending: Emergency Medicine | Admitting: Emergency Medicine

## 2024-07-25 DIAGNOSIS — S90561A Insect bite (nonvenomous), right ankle, initial encounter: Secondary | ICD-10-CM | POA: Insufficient documentation

## 2024-07-25 DIAGNOSIS — L03115 Cellulitis of right lower limb: Secondary | ICD-10-CM | POA: Diagnosis not present

## 2024-07-25 DIAGNOSIS — M25571 Pain in right ankle and joints of right foot: Secondary | ICD-10-CM | POA: Diagnosis present

## 2024-07-25 DIAGNOSIS — W57XXXA Bitten or stung by nonvenomous insect and other nonvenomous arthropods, initial encounter: Secondary | ICD-10-CM | POA: Diagnosis not present

## 2024-07-25 MED ORDER — CEPHALEXIN 500 MG PO CAPS
500.0000 mg | ORAL_CAPSULE | Freq: Once | ORAL | Status: AC
Start: 2024-07-25 — End: 2024-07-25
  Administered 2024-07-25: 500 mg via ORAL
  Filled 2024-07-25: qty 1

## 2024-07-25 MED ORDER — CEPHALEXIN 500 MG PO CAPS: 500.0000 mg | ORAL_CAPSULE | Freq: Four times a day (QID) | ORAL | 0 refills | Status: AC
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
## 2024-07-25 NOTE — Discharge Instructions (Addendum)
 You have been seen today in the Emergency Department (ED) for cellulitis, a superficial skin infection. Please take your antibiotics as prescribed for their ENTIRE prescribed duration.  Take Tylenol  as needed for pain, but only as directed by your OBGYN.  Please follow up with your doctor or in the ED in 24-48 hours for recheck of your infection if you are not improving or for any concerns during your pregnancy.  Call your doctor sooner or return to the ED if you develop worsening signs of infection such as: increased redness, increased pain, pus, fever, or other symptoms that concern you. Please also follow up with your OBGYN regarding today's visit.

## 2024-07-25 NOTE — ED Triage Notes (Signed)
 Pt to ED via Pov from home. Pt reports insect bite to right ankle this morning around 2-3am. Pt reports increased swelling and itching. Redness and swelling noted. No pregnancy concerns. Still feels baby move. Pt is G4P2.

## 2024-07-30 LAB — T.PALLIDUM AB, TOTAL: Treponema pallidum Antibodies: REACTIVE — AB

## 2024-08-04 ENCOUNTER — Ambulatory Visit (INDEPENDENT_AMBULATORY_CARE_PROVIDER_SITE_OTHER)

## 2024-08-04 DIAGNOSIS — O99333 Smoking (tobacco) complicating pregnancy, third trimester: Secondary | ICD-10-CM

## 2024-08-04 DIAGNOSIS — Z3A30 30 weeks gestation of pregnancy: Secondary | ICD-10-CM

## 2024-08-04 DIAGNOSIS — F17210 Nicotine dependence, cigarettes, uncomplicated: Secondary | ICD-10-CM

## 2024-08-05 ENCOUNTER — Encounter: Admitting: Obstetrics

## 2024-08-05 ENCOUNTER — Ambulatory Visit: Admitting: Obstetrics

## 2024-08-05 VITALS — BP 107/65 | HR 78 | Wt 140.3 lb

## 2024-08-05 DIAGNOSIS — O99019 Anemia complicating pregnancy, unspecified trimester: Secondary | ICD-10-CM | POA: Insufficient documentation

## 2024-08-05 DIAGNOSIS — A5901 Trichomonal vulvovaginitis: Secondary | ICD-10-CM | POA: Diagnosis not present

## 2024-08-05 DIAGNOSIS — F17210 Nicotine dependence, cigarettes, uncomplicated: Secondary | ICD-10-CM | POA: Diagnosis not present

## 2024-08-05 DIAGNOSIS — O34219 Maternal care for unspecified type scar from previous cesarean delivery: Secondary | ICD-10-CM

## 2024-08-05 DIAGNOSIS — Z3A28 28 weeks gestation of pregnancy: Secondary | ICD-10-CM

## 2024-08-05 DIAGNOSIS — O23593 Infection of other part of genital tract in pregnancy, third trimester: Secondary | ICD-10-CM

## 2024-08-05 DIAGNOSIS — D649 Anemia, unspecified: Secondary | ICD-10-CM

## 2024-08-05 DIAGNOSIS — O99333 Smoking (tobacco) complicating pregnancy, third trimester: Secondary | ICD-10-CM | POA: Diagnosis not present

## 2024-08-05 DIAGNOSIS — O99013 Anemia complicating pregnancy, third trimester: Secondary | ICD-10-CM | POA: Diagnosis not present

## 2024-08-05 DIAGNOSIS — Z98891 History of uterine scar from previous surgery: Secondary | ICD-10-CM

## 2024-08-05 MED ORDER — DOCUSATE SODIUM 100 MG PO CAPS: 100.0000 mg | ORAL_CAPSULE | Freq: Every day | ORAL | 0 refills | Status: DC

## 2024-08-05 MED ORDER — FERROUS SULFATE 325 (65 FE) MG PO TBEC: 325.0000 mg | DELAYED_RELEASE_TABLET | Freq: Every day | ORAL | 0 refills | Status: DC

## 2024-08-05 NOTE — Progress Notes (Signed)
    Return Prenatal Note   Subjective  38 y.o. H4E7977 at [redacted]w[redacted]d presents for this follow-up prenatal visit.   Pt wanting to discuss trichomonas dx from prior visit. Reports no sexual activity since Feb '25. She did complete the Flagyl .   Patient reports: Movement: Present Contractions: Not present Denies vaginal bleeding or leaking fluid. Objective  Flow sheet Vitals: Pulse Rate: 78 BP: 107/65 Fundal Height: 30 cm Fetal Heart Rate (bpm): 134 Total weight gain: 18 lb 4.8 oz (8.301 kg)  General Appearance  No acute distress, well appearing, and well nourished Pulmonary   Normal work of breathing Neurologic   Alert and oriented to person, place, and time Psychiatric   Mood and affect within normal limits   Assessment/Plan   Plan  38 y.o. [id number] at [redacted]w[redacted]d by LMP=11wk US  presents for follow-up OB visit. Reviewed prenatal record including previous visit note.  1. Supervision of other normal pregnancy, antepartum (Primary) -BTC, Tdap, 28wk labs completed today -1hGTT done in office -3rd trimester anticipatory guidance given; discussed work request and that we cannot approve complete leave from work unless we have a medical reason; can discuss with her HR dept to modify her job requirements and happy to sign off of light duty or other modifications to help her comfort level.  2. Hx of prior CD x 2 -VBAC calculator 71.2% success -Counseled regarding TOLAC vs RCD; risks/benefits discussed in detail. All questions answered.  Patient elects for repeat CD, consent signed 08/05/2024.  -She would like to have next ROB together to sit on her decision and discuss further.   3. Trichomonas in pregnancy -Dx on 8/12 swab, s/p Metronidazole  -Needs TOC at next ROB, ordered  4. Tobacco use:  -Cessation encouraged  5. Anemia in pregnancy -Start iron w/colace daily; discussed why this is important for her baby's health and she agrees to take after initially stating she would not.   Return  in about 2 weeks (around 08/19/2024) for ROB.    For next visit:  continue with routine prenatal care    Estil Mangle, DO [facility]

## 2024-08-19 ENCOUNTER — Ambulatory Visit (INDEPENDENT_AMBULATORY_CARE_PROVIDER_SITE_OTHER): Admitting: Obstetrics

## 2024-08-19 ENCOUNTER — Other Ambulatory Visit: Payer: Self-pay

## 2024-08-19 ENCOUNTER — Other Ambulatory Visit (HOSPITAL_COMMUNITY)
Admission: RE | Admit: 2024-08-19 | Discharge: 2024-08-19 | Disposition: A | Discharge status: Home / Self Care | Source: Ambulatory Visit | Attending: Obstetrics | Admitting: Obstetrics

## 2024-08-19 VITALS — BP 106/67 | HR 98 | Wt 143.0 lb

## 2024-08-19 DIAGNOSIS — O34219 Maternal care for unspecified type scar from previous cesarean delivery: Secondary | ICD-10-CM | POA: Diagnosis not present

## 2024-08-19 DIAGNOSIS — Z3A28 28 weeks gestation of pregnancy: Secondary | ICD-10-CM | POA: Diagnosis not present

## 2024-08-19 DIAGNOSIS — A5901 Trichomonal vulvovaginitis: Secondary | ICD-10-CM | POA: Diagnosis not present

## 2024-08-19 DIAGNOSIS — O23593 Infection of other part of genital tract in pregnancy, third trimester: Secondary | ICD-10-CM | POA: Diagnosis not present

## 2024-08-19 DIAGNOSIS — O99333 Smoking (tobacco) complicating pregnancy, third trimester: Secondary | ICD-10-CM

## 2024-08-19 DIAGNOSIS — O0990 Supervision of high risk pregnancy, unspecified, unspecified trimester: Secondary | ICD-10-CM

## 2024-08-19 DIAGNOSIS — Z8619 Personal history of other infectious and parasitic diseases: Secondary | ICD-10-CM

## 2024-08-19 DIAGNOSIS — F17210 Nicotine dependence, cigarettes, uncomplicated: Secondary | ICD-10-CM | POA: Diagnosis not present

## 2024-08-19 DIAGNOSIS — O99013 Anemia complicating pregnancy, third trimester: Secondary | ICD-10-CM

## 2024-08-19 DIAGNOSIS — O09893 Supervision of other high risk pregnancies, third trimester: Secondary | ICD-10-CM

## 2024-08-19 DIAGNOSIS — D649 Anemia, unspecified: Secondary | ICD-10-CM

## 2024-08-19 DIAGNOSIS — Z98891 History of uterine scar from previous surgery: Secondary | ICD-10-CM

## 2024-08-19 DIAGNOSIS — F17200 Nicotine dependence, unspecified, uncomplicated: Secondary | ICD-10-CM

## 2024-08-19 NOTE — Progress Notes (Unsigned)
    Return Prenatal Note   Subjective  38 y.o. [id number] at [redacted]w[redacted]d presents for this follow-up prenatal visit. 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   Doing TOC for TV today.  Not taking iron, I don't feel like I'm anemic.  Initially requesting to have tubes tied, but didn't realize this is a permanent procedure, she was under the impression it only lasted 34yrs. Desires to go on Depo after delivery, used in past and works well for pt Still smoking. Pt quit her job, much less stress and no longer straining her back.  Patient reports: Movement: Present Contractions: Not present Denies vaginal bleeding or leaking fluid. Objective  Flow sheet Vitals: Pulse Rate: 98 BP: 106/67 Fundal Height: 32 cm Fetal Heart Rate (bpm): 153 Total weight gain: 21 lb (9.526 kg)  General Appearance  No acute distress, well appearing, and well nourished Pulmonary   Normal work of breathing Neurologic   Alert and oriented to person, place, and time Psychiatric   Mood and affect within normal limits   Assessment/Plan   Plan  38 y.o. H4E7977 at [redacted]w[redacted]d by LMP=11wk US  presents for follow-up OB visit. Reviewed prenatal record including previous visit note.  1. Supervision of other normal pregnancy, antepartum (Primary) -Reviewed 28wk labs together, passed glucola -Declines Flu and RSV vaccines, counseled on recommendations  2. Hx of prior CD x 2 -Still desires repeat CD, is scheduled for 10/08/24 at 730 -Plan for clear drape, ON-Q, and long cord; daughter will be her OR support person  3. Trichomonas in pregnancy -Dx on 8/12 swab, s/p Metronidazole ; TOC today  4. Tobacco use:  -Cessation again encouraged, has Rx for patches  5. Anemia in pregnancy -Start iron w/colace daily! Reiterated why this is important for her baby's health and she agrees to take after initially stating she would not.  Despite her not feeling anemic we reviewed why this is important for fetus.   6. Hx of syphilis:  -S/p treatment in 2024 -Counseled pt that RPR will continue to be reactive after and past infection, titers 1:1, reassuring  7. HSV-2: -Valtrex at 36wks   Return in about 2 weeks (around 09/02/2024) for ROB.    For next visit:  continue with routine prenatal care    Estil Mangle, DO [facility]

## 2024-08-20 NOTE — Patient Instructions (Signed)
 PRE-OPERATIVE INSTRUCTIONS  You are scheduled for surgery on 10/08/24.  The name of your procedure is: repeat cesarean delivery. Your surgery time is scheduled for 7:30 am.   Please read through these instructions carefully regarding preparation for your surgery:  Nothing to eat after midnight on the day prior to surgery.  Arrive to the hospital 2hr prior to your surgery time: 5:30am. Enter through the Emergency Room entrance.  Do not take any medications unless recommended by your provider on day prior to surgery.  You will be contacted by phone approximately 1-2 weeks prior to surgery to schedule your pre-operative appointment.  Please call the office if you have any questions regarding your upcoming surgery.

## 2024-08-21 LAB — CERVICOVAGINAL ANCILLARY ONLY
Comment: NEGATIVE
Trichomonas: NEGATIVE

## 2024-08-29 NOTE — Progress Notes (Addendum)
    Return Prenatal Note   Assessment/Plan   Plan  38 y.o. H4E7977 at [redacted]w[redacted]d presents for follow-up OB visit. Reviewed prenatal record including previous visit note.  History of cesarean delivery -Cesarean is scheduled for 10/08/24 -Discussed what to do if she labors before her CS date  Anemia during pregnancy in third trimester -Encouraged to take iron and continue to increase her dietary iron. Rx sent for Accrufer   Trichomonal vaginitis during pregnancy in third trimester -TOC negative 08/19/24  Supervision of high risk pregnancy, antepartum -Reviewed labor warning signs. Instructed to call office or come to hospital with persistent headache, vision changes, regular contractions, leaking of fluid, decreased fetal movement or vaginal bleeding.      No orders of the defined types were placed in this encounter.  No follow-ups on file.   Future Appointments  Date Time Provider Department Center  09/16/2024 10:15 AM Leigh Sober, MD [facility] None  10/06/2024  8:00 AM [facility] None    For next visit:  ROB with GBS and GC/chlamydia    Subjective   Cynthia Davies reports that she has not been taking her iron because she does not feel anemic and because [facility] would not fill the prescription. She has questions about what to do if she labors before her CS date. Ready to meet baby!  Movement: Present Contractions: Irritability  Objective   Flow sheet Vitals: Pulse Rate: 96 BP: 95/60 Fundal Height: 34 cm Fetal Heart Rate (bpm): 155 Total weight gain: 21 lb (9.526 kg)  General Appearance  No acute distress, well appearing, and well nourished Pulmonary   Normal work of breathing Neurologic   Alert and oriented to person, place, and time Psychiatric   Mood and affect within normal limits  Eleanor Canny, CNM 09/01/24 5:11 PM

## 2024-09-01 ENCOUNTER — Ambulatory Visit (INDEPENDENT_AMBULATORY_CARE_PROVIDER_SITE_OTHER): Admitting: Obstetrics

## 2024-09-01 ENCOUNTER — Encounter: Payer: Self-pay | Admitting: Obstetrics

## 2024-09-01 VITALS — BP 95/60 | HR 96 | Wt 143.0 lb

## 2024-09-01 DIAGNOSIS — O23593 Infection of other part of genital tract in pregnancy, third trimester: Secondary | ICD-10-CM

## 2024-09-01 DIAGNOSIS — Z8619 Personal history of other infectious and parasitic diseases: Secondary | ICD-10-CM

## 2024-09-01 DIAGNOSIS — O34219 Maternal care for unspecified type scar from previous cesarean delivery: Secondary | ICD-10-CM

## 2024-09-01 DIAGNOSIS — O09299 Supervision of pregnancy with other poor reproductive or obstetric history, unspecified trimester: Secondary | ICD-10-CM

## 2024-09-01 DIAGNOSIS — O99013 Anemia complicating pregnancy, third trimester: Secondary | ICD-10-CM

## 2024-09-01 DIAGNOSIS — Z3A34 34 weeks gestation of pregnancy: Secondary | ICD-10-CM

## 2024-09-01 DIAGNOSIS — Z98891 History of uterine scar from previous surgery: Secondary | ICD-10-CM

## 2024-09-01 DIAGNOSIS — D649 Anemia, unspecified: Secondary | ICD-10-CM | POA: Diagnosis not present

## 2024-09-01 DIAGNOSIS — O0990 Supervision of high risk pregnancy, unspecified, unspecified trimester: Secondary | ICD-10-CM

## 2024-09-01 DIAGNOSIS — A5901 Trichomonal vulvovaginitis: Secondary | ICD-10-CM

## 2024-09-01 DIAGNOSIS — F17200 Nicotine dependence, unspecified, uncomplicated: Secondary | ICD-10-CM

## 2024-09-01 DIAGNOSIS — O09293 Supervision of pregnancy with other poor reproductive or obstetric history, third trimester: Secondary | ICD-10-CM | POA: Diagnosis not present

## 2024-09-01 DIAGNOSIS — Z62810 Personal history of physical and sexual abuse in childhood: Secondary | ICD-10-CM

## 2024-09-01 MED ORDER — ACCRUFER 30 MG PO CAPS: 1.0000 | ORAL_CAPSULE | Freq: Two times a day (BID) | ORAL | 6 refills | Status: DC

## 2024-09-01 NOTE — Assessment & Plan Note (Signed)
-  Encouraged to take iron and continue to increase her dietary iron. Rx sent for Accrufer

## 2024-09-01 NOTE — Assessment & Plan Note (Signed)
-  Reviewed labor warning signs. Instructed to call office or come to hospital with persistent headache, vision changes, regular contractions, leaking of fluid, decreased fetal movement or vaginal bleeding.

## 2024-09-01 NOTE — Assessment & Plan Note (Addendum)
-  TOC negative 08/19/24

## 2024-09-01 NOTE — Assessment & Plan Note (Signed)
-  Cesarean is scheduled for 10/08/24 -Discussed what to do if she labors before her CS date

## 2024-09-11 NOTE — Patient Instructions (Signed)

## 2024-09-16 ENCOUNTER — Ambulatory Visit (INDEPENDENT_AMBULATORY_CARE_PROVIDER_SITE_OTHER): Admitting: Obstetrics

## 2024-09-16 ENCOUNTER — Other Ambulatory Visit (HOSPITAL_COMMUNITY)
Admission: RE | Admit: 2024-09-16 | Discharge: 2024-09-16 | Disposition: A | Discharge status: Home / Self Care | Source: Ambulatory Visit | Attending: Obstetrics | Admitting: Obstetrics

## 2024-09-16 VITALS — BP 112/76 | HR 88 | Wt 146.1 lb

## 2024-09-16 DIAGNOSIS — Z3A36 36 weeks gestation of pregnancy: Secondary | ICD-10-CM

## 2024-09-16 DIAGNOSIS — O0990 Supervision of high risk pregnancy, unspecified, unspecified trimester: Secondary | ICD-10-CM | POA: Insufficient documentation

## 2024-09-16 DIAGNOSIS — Z98891 History of uterine scar from previous surgery: Secondary | ICD-10-CM

## 2024-09-16 DIAGNOSIS — O99013 Anemia complicating pregnancy, third trimester: Secondary | ICD-10-CM | POA: Diagnosis not present

## 2024-09-16 DIAGNOSIS — N898 Other specified noninflammatory disorders of vagina: Secondary | ICD-10-CM

## 2024-09-16 DIAGNOSIS — Z8619 Personal history of other infectious and parasitic diseases: Secondary | ICD-10-CM

## 2024-09-16 DIAGNOSIS — F17200 Nicotine dependence, unspecified, uncomplicated: Secondary | ICD-10-CM | POA: Diagnosis not present

## 2024-09-16 MED ORDER — VALACYCLOVIR HCL 500 MG PO TABS
500.0000 mg | ORAL_TABLET | Freq: Every day | ORAL | 0 refills | Status: AC
Start: 2024-09-16 — End: ?

## 2024-09-16 NOTE — Progress Notes (Signed)
    Return Prenatal Note   Subjective  38 y.o. Cynthia Davies at [redacted]w[redacted]d presents for this follow-up prenatal visit. 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   Prior TV: TOC negative Anemia: still not taking iron, ins won't cover and pt can't afford, open to IV Patient would like to have her c-section earlier if possible.   Vaginal itching: requests we test for yeast and BV as she is having some vaginal irrirtation. No known exposure.  Still smoking.  Hx of HSV2: has not received Valtrex  Patient reports: Movement: Present Contractions: Not present Denies vaginal bleeding or leaking fluid. Objective  Flow sheet Vitals: Pulse Rate: 88 BP: 112/76 Fundal Height: 36 cm Fetal Heart Rate (bpm): 140 Total weight gain: 24 lb 1.6 oz (10.9 kg)  General Appearance  No acute distress, well appearing, and well nourished Pulmonary   Normal work of breathing Neurologic   Alert and oriented to person, place, and time Psychiatric   Mood and affect within normal limits   Assessment/Plan   Plan  38 y.o. Cynthia Davies at [redacted]w[redacted]d presents for follow-up OB visit. Reviewed prenatal record including previous visit note.  1. Supervision of high risk pregnancy, antepartum (Primary) -GBS and GCCT swabs done today -Labor precautions given  2. Vaginal irritation - Swab for BV, yeast  3. History of cesarean delivery -Scheduled for 10/29 at 39.5, unable to move date up due to scheduling/staffing, discussed with pt -Keep scheduled pre-admit testing visit 10/27  4. Anemia during pregnancy in third trimester -Pt has been prescribed iron, insurance not covering and she cannot afford -Will order Venofer x 2 in preparation for her upcoming CD  6. Smoker 1/2 ppd -Cessation, not using patches  7. History of herpes genitalis - Rx for Valtrex sent, start today   Orders Placed This Encounter  Procedures   Culture,  beta strep (group b only)   Amb Referral to Intravenous Iron Therapy    Referral Priority:   Routine    Referral Type:   Consultation    Number of Visits Requested:   1   Return in about 1 week (around 09/23/2024) for ROB.   Future Appointments  Date Time Provider Department Center  09/22/2024 11:15 AM Slaughterbeck, Damien, CNM [facility] None  10/01/2024  1:15 PM Charma Domino, CNM [facility] None  10/06/2024  8:00 AM [facility] None  10/15/2024  2:35 PM Leigh Sober, MD [facility] None    For next visit:  continue with routine prenatal care    Sober Leigh, DO [facility]

## 2024-09-17 ENCOUNTER — Telehealth (HOSPITAL_COMMUNITY): Payer: Self-pay | Admitting: Obstetrics

## 2024-09-17 LAB — CERVICOVAGINAL ANCILLARY ONLY
Bacterial Vaginitis (gardnerella): NEGATIVE
Candida Glabrata: NEGATIVE
Candida Vaginitis: POSITIVE — AB
Chlamydia: NEGATIVE
Comment: NEGATIVE
Comment: NEGATIVE
Comment: NEGATIVE
Comment: NEGATIVE
Comment: NEGATIVE
Comment: NORMAL
Neisseria Gonorrhea: NEGATIVE
Trichomonas: NEGATIVE

## 2024-09-17 NOTE — Telephone Encounter (Signed)
 Patient referred to infusion pharmacy team for ambulatory infusion of IV iron.  Insurance - [place] Medicaid Prepaid Plan  Site of care - Site of care: [facility] Dx code - O99.013  IV Iron Therapy - Patient has Venofer 200 mg Iv x 1 ordered. MD placed order for venofer 200 mg Iv x 2  Infusion appointments - Scheduling team will schedule patient as soon as possible.    Cynthia Davies D. Vali Capano, PharmD

## 2024-09-18 ENCOUNTER — Other Ambulatory Visit: Payer: Self-pay | Admitting: Obstetrics

## 2024-09-18 ENCOUNTER — Ambulatory Visit: Payer: Self-pay | Admitting: Obstetrics

## 2024-09-18 DIAGNOSIS — B3731 Acute candidiasis of vulva and vagina: Secondary | ICD-10-CM

## 2024-09-18 DIAGNOSIS — B379 Candidiasis, unspecified: Secondary | ICD-10-CM

## 2024-09-18 MED ORDER — FLUCONAZOLE 150 MG PO TABS: 150.0000 mg | ORAL_TABLET | Freq: Every day | ORAL | 0 refills | Status: DC

## 2024-09-18 NOTE — Progress Notes (Signed)
    Return Prenatal Note   Subjective   38 y.o. H4E7977 at [redacted]w[redacted]d presents for this follow-up prenatal visit.  Patient is doing well. She has no new concerns today. She reports good fetal movement.  Patient reports: Movement: Present Contractions: Not present  Objective   Flow sheet Vitals: BP: 104/76 Fundal Height: 37 cm Fetal Heart Rate (bpm): 140 Total weight gain: 27 lb 4.8 oz (12.4 kg)  General Appearance  No acute distress, well appearing, and well nourished Pulmonary   Normal work of breathing Neurologic   Alert and oriented to person, place, and time Psychiatric   Mood and affect within normal limits   Assessment/Plan   Plan  38 y.o. H4E7977 at [redacted]w[redacted]d presents for follow-up OB visit. Reviewed prenatal record including previous visit note.  History of herpes genitalis Reports she has been taking Valtrex daily  Anemia during pregnancy She is not taking daily iron. She is ordered for an iron transfusion but has not been called to schedule as of yet. Encouraged to reach out.   Supervision of high risk pregnancy, antepartum rC/D scheduled for 10/29. Reviewed what to do if she goes into labor before. Reviewed labor warning signs and expectations for birth. Instructed to call office or come to hospital with persistent headache, vision changes, regular contractions, leaking of fluid, decreased fetal movement or vaginal bleeding.       No orders of the defined types were placed in this encounter.  No follow-ups on file.   Future Appointments  Date Time Provider Department Center  10/01/2024  9:45 AM Charma Domino, CNM [facility] None  10/06/2024  8:00 AM [facility] None  10/15/2024  2:35 PM Leigh Sober, MD [facility] None    For next visit:  continue with routine prenatal care     Damien Parsley, CNM [facility] 10/13/251:19 PM

## 2024-09-20 LAB — CULTURE, BETA STREP (GROUP B ONLY): Strep Gp B Culture: NEGATIVE

## 2024-09-22 ENCOUNTER — Ambulatory Visit: Admitting: Certified Nurse Midwife

## 2024-09-22 VITALS — BP 104/76 | Wt 149.3 lb

## 2024-09-22 DIAGNOSIS — O99013 Anemia complicating pregnancy, third trimester: Secondary | ICD-10-CM

## 2024-09-22 DIAGNOSIS — O99019 Anemia complicating pregnancy, unspecified trimester: Secondary | ICD-10-CM

## 2024-09-22 DIAGNOSIS — O09293 Supervision of pregnancy with other poor reproductive or obstetric history, third trimester: Secondary | ICD-10-CM | POA: Diagnosis not present

## 2024-09-22 DIAGNOSIS — Z3A36 36 weeks gestation of pregnancy: Secondary | ICD-10-CM | POA: Diagnosis not present

## 2024-09-22 DIAGNOSIS — O0990 Supervision of high risk pregnancy, unspecified, unspecified trimester: Secondary | ICD-10-CM

## 2024-09-22 DIAGNOSIS — D649 Anemia, unspecified: Secondary | ICD-10-CM | POA: Diagnosis not present

## 2024-09-22 DIAGNOSIS — Z8619 Personal history of other infectious and parasitic diseases: Secondary | ICD-10-CM

## 2024-09-22 NOTE — Assessment & Plan Note (Signed)
 Reports she has been taking Valtrex daily

## 2024-09-22 NOTE — Assessment & Plan Note (Signed)
 She is not taking daily iron. She is ordered for an iron transfusion but has not been called to schedule as of yet. Encouraged to reach out.

## 2024-09-22 NOTE — Assessment & Plan Note (Signed)
 rC/D scheduled for 10/29. Reviewed what to do if she goes into labor before. Reviewed labor warning signs and expectations for birth. Instructed to call office or come to hospital with persistent headache, vision changes, regular contractions, leaking of fluid, decreased fetal movement or vaginal bleeding.

## 2024-09-26 NOTE — Progress Notes (Unsigned)
    Return Prenatal Note   Subjective   38 y.o. H4E7977 at [redacted]w[redacted]d presents for this follow-up prenatal visit.  Patient has iron infusion appt tomorrow. Scheduled for repeat c/s on 10/29 with Dr. Leigh. Patient reports: Movement: Present  Objective   Flow sheet Vitals: Pulse Rate: 88 BP: 106/69 Fundal Height: 38 cm Fetal Heart Rate (bpm): 135 Presentation: Vertex Total weight gain: 29 lb 4.8 oz (13.3 kg)  General Appearance  No acute distress, well appearing, and well nourished Pulmonary   Normal work of breathing Neurologic   Alert and oriented to person, place, and time Psychiatric   Mood and affect within normal limits   Assessment/Plan   Plan  38 y.o. H4E7977 at [redacted]w[redacted]d presents for follow-up OB visit. Reviewed prenatal record including previous visit note.  History of cesarean delivery Scheduled repeat LTCS 10/29 with Dr. Leigh. Will schedule 1 wk postop visit for incision check today (will be two weeks from now)   No orders of the defined types were placed in this encounter.  Return in about 2 weeks (around 10/15/2024) for Postop incision check (has c/s scheduled 10/19).   Future Appointments  Date Time Provider Department Center  10/02/2024 11:15 AM Babara Call, MD [facility] None  10/02/2024 11:45 AM [facility] None  10/06/2024  8:00 AM [facility] None  10/15/2024  2:35 PM Leigh Sober, MD [facility] None       Lauraine Lakes, CNM 10/01/24 10:21 AM

## 2024-09-29 ENCOUNTER — Telehealth: Payer: Self-pay | Admitting: Obstetrics

## 2024-09-29 ENCOUNTER — Telehealth (HOSPITAL_COMMUNITY): Payer: Self-pay

## 2024-09-29 NOTE — Telephone Encounter (Signed)
 Patient referred to infusion pharmacy team for ambulatory infusion of IV iron.  Insurance - [place] Medicaid Prepaid Site of care - Site of care: [facility] Dx code - O99.013 IV Iron Therapy - Venofer 200 mg x 5  Infusion appointments - Scheduling team will schedule patient as soon as possible.   Thank you,  Norton Blush, PharmD, BCSCP [facility]

## 2024-09-29 NOTE — Telephone Encounter (Signed)
 Auth Submission: NO AUTH NEEDED Site of care: Site of care: [facility] Payer: [employer] manager of  Medication & CPT/J Code(s) submitted: Venofer (Iron Sucrose) J1756 Diagnosis Code: O99.013 Route of submission (phone, fax, portal):  Phone # Fax # Auth type: Buy/Bill HB Units/visits requested: 200mg  x 5 doses Reference number:  Approval from: 09/29/24 to 12/10/24

## 2024-09-29 NOTE — Addendum Note (Signed)
 Addended by: VICCI ROLLO BRAVO on: 09/29/2024 11:02 AM   Modules accepted: Orders

## 2024-10-01 ENCOUNTER — Encounter: Payer: Self-pay | Admitting: Registered Nurse

## 2024-10-01 ENCOUNTER — Ambulatory Visit: Admitting: Registered Nurse

## 2024-10-01 VITALS — BP 106/69 | HR 88 | Wt 151.3 lb

## 2024-10-01 DIAGNOSIS — Z8619 Personal history of other infectious and parasitic diseases: Secondary | ICD-10-CM

## 2024-10-01 DIAGNOSIS — Z3A38 38 weeks gestation of pregnancy: Secondary | ICD-10-CM | POA: Diagnosis not present

## 2024-10-01 DIAGNOSIS — Z98891 History of uterine scar from previous surgery: Secondary | ICD-10-CM

## 2024-10-01 DIAGNOSIS — O0990 Supervision of high risk pregnancy, unspecified, unspecified trimester: Secondary | ICD-10-CM

## 2024-10-01 DIAGNOSIS — O99019 Anemia complicating pregnancy, unspecified trimester: Secondary | ICD-10-CM

## 2024-10-01 NOTE — Assessment & Plan Note (Signed)
 Scheduled repeat LTCS 10/29 with Dr. Leigh. Will schedule 1 wk postop visit for incision check today (will be two weeks from now)

## 2024-10-02 ENCOUNTER — Inpatient Hospital Stay

## 2024-10-02 ENCOUNTER — Inpatient Hospital Stay: Attending: Oncology | Admitting: Oncology

## 2024-10-06 ENCOUNTER — Encounter
Admission: RE | Admit: 2024-10-06 | Discharge: 2024-10-06 | Disposition: A | Discharge status: Home / Self Care | Source: Ambulatory Visit | Attending: Obstetrics | Admitting: Obstetrics

## 2024-10-06 ENCOUNTER — Other Ambulatory Visit: Payer: Self-pay

## 2024-10-06 DIAGNOSIS — Z01812 Encounter for preprocedural laboratory examination: Secondary | ICD-10-CM | POA: Diagnosis not present

## 2024-10-06 DIAGNOSIS — O0993 Supervision of high risk pregnancy, unspecified, third trimester: Secondary | ICD-10-CM | POA: Diagnosis not present

## 2024-10-06 DIAGNOSIS — Z98891 History of uterine scar from previous surgery: Secondary | ICD-10-CM | POA: Insufficient documentation

## 2024-10-06 DIAGNOSIS — Z3A39 39 weeks gestation of pregnancy: Secondary | ICD-10-CM | POA: Insufficient documentation

## 2024-10-06 DIAGNOSIS — Z01818 Encounter for other preprocedural examination: Secondary | ICD-10-CM | POA: Diagnosis present

## 2024-10-06 DIAGNOSIS — O0990 Supervision of high risk pregnancy, unspecified, unspecified trimester: Secondary | ICD-10-CM

## 2024-10-06 LAB — TYPE AND SCREEN
ABO/RH(D): O POS
Antibody Screen: NEGATIVE
Extend sample reason: UNDETERMINED

## 2024-10-06 LAB — CBC
HCT: 30.6 % — ABNORMAL LOW (ref 36.0–46.0)
Hemoglobin: 10.2 g/dL — ABNORMAL LOW (ref 12.0–15.0)
MCH: 30.2 pg (ref 26.0–34.0)
MCHC: 33.3 g/dL (ref 30.0–36.0)
MCV: 90.5 fL (ref 80.0–100.0)
Platelets: 222 K/uL (ref 150–400)
RBC: 3.38 MIL/uL — ABNORMAL LOW (ref 3.87–5.11)
RDW: 13.8 % (ref 11.5–15.5)
WBC: 6.6 K/uL (ref 4.0–10.5)
nRBC: 0 % (ref 0.0–0.2)

## 2024-10-06 NOTE — Patient Instructions (Addendum)
 Your procedure is scheduled on:  [facility] OCTOBER 29   Arrival Time: Please call Labor and Delivery the day before your scheduled C-Section to find out your arrival time. [phone].  Arrival: If your arrival time is prior to 6:00 am, please enter through the Emergency Room Entrance and you will be directed to Labor and Delivery. If your arrival time is 6:00 am or later, please enter the Medical Mall and follow the greeter's instructions.  REMEMBER: Instructions that are not followed completely may result in serious medical risk, up to and including death; or upon the discretion of your surgeon and anesthesiologist your surgery may need to be rescheduled.  Do not eat food after midnight the night before surgery.  No gum chewing or hard candies.  One week prior to surgery: Stop Anti-inflammatories (NSAIDS) such as Advil , Aleve, Ibuprofen , Motrin , Naproxen, Naprosyn and Aspirin based products such as Excedrin, Goody's Powder, BC Powder. Stop ANY OVER THE COUNTER supplements until after surgery.  You may however, continue to take Tylenol  if needed for pain up until the day of surgery.  Continue taking all of your other prescription medications up until the day of surgery.  ON THE DAY OF SURGERY DO NOT TAKE ANY MEDICATION   No Smoking including e-cigarettes for 24 hours prior to surgery.   No nicotine  patches on the day of surgery.  Do not use any recreational drugs for at least a week prior to your surgery.  Please be advised that the combination of cocaine and anesthesia may have negative outcomes, up to and including death. If you test positive for cocaine, your surgery will be cancelled.  On the morning of surgery brush your teeth with toothpaste and water, you may rinse your mouth with mouthwash if you wish. Do not swallow any toothpaste or mouthwash.  Use CHG wipes as directed on instruction sheet.  Do not wear jewelry, make-up, hairpins, clips or nail polish.  For  welded (permanent) jewelry: bracelets, anklets, waist bands, etc.  Please have this removed prior to surgery.  If it is not removed, there is a chance that hospital personnel will need to cut it off on the day of surgery.  Do not wear lotions, powders, or perfumes.   Do not shave body hair from the neck down 48 hours before surgery.  Do not bring valuables to the hospital. [facility] is not responsible for any missing/lost belongings or valuables.   Notify your doctor if there is any change in your medical condition (cold, fever, infection).  Wear comfortable clothing (specific to your surgery type) to the hospital.  After surgery, you can help prevent lung complications by doing breathing exercises.  Take deep breaths and cough every 1-2 hours.  Please call the Pre-admissions Testing Dept. at [phone] if you have any questions about these instructions.  Surgery Visitation Policy:  Visitor Passes   All visitors, including children, need an identification sticker when visiting. These stickers must be worn where they can be seen.   Labor & Delivery  Laboring women may have one designated support person and two other visitors of any age visit. The support person must remain the same. The visitors may switch with other visitors. Visitation is permitted 24 hours per day. The designated support person or a visitor over the age of 16 may sleep overnight in the patient's room. A doula registered with [facility] for labor and delivery support is not considered a visitor. Doulas not registered with [facility] are considered visitors.  Mother Baby Unit, OB Specialty and Gynecological Care  A designated support person and three visitors of any age may visit. The three visitors may switch out. The designated support person or a visitor age 43 or older may stay overnight in the room. During the postpartum period (up to 6 weeks), if the mother is the patient, she can have her newborn  stay with her if there is another support person present who can be responsible for the baby.   Preparing the Skin Before Surgery  To help prevent the risk of infection at your surgical site, we are now providing you with rinse-free Sage 2% Chlorhexidine Gluconate (CHG) disposable wipes.  Chlorhexidine Gluconate (CHG) Soap  o An antiseptic cleaner that kills germs and bonds with the skin to continue killing germs even after washing  o Used for showering the night before surgery and morning of surgery  The night before surgery: Shower or bathe with warm water. Do not apply perfume, lotions, powders. Wait one hour after shower. Skin should be dry and cool. Open Sage wipe package - use 6 disposable cloths. Wipe body using one cloth for the right arm, one cloth for the left arm, one cloth for the right leg, one cloth for the left leg, one cloth for the chest/abdomen area, and one cloth for the back. Do not use on open wounds or sores. Do not use on face or genitals (private parts). If you are breast feeding, do not use on breasts. 5. Do not rinse, allow to dry. 6. Skin may feel tacky for several minutes. 7. Dress in clean clothes. 8. Place clean sheets on your bed and do not sleep with pets.  REPEAT ABOVE ON THE MORNING OF SURGERY BEFORE ARRIVING TO THE HOSPITAL.

## 2024-10-07 LAB — RPR: RPR Ser Ql: NONREACTIVE

## 2024-10-08 ENCOUNTER — Encounter
Admission: RE | Disposition: A | Discharge status: Home / Self Care | Payer: Self-pay | Source: Home / Self Care | Attending: Obstetrics

## 2024-10-08 ENCOUNTER — Encounter: Payer: Self-pay | Admitting: Obstetrics

## 2024-10-08 ENCOUNTER — Inpatient Hospital Stay

## 2024-10-08 ENCOUNTER — Inpatient Hospital Stay
Admission: RE | Admit: 2024-10-08 | Discharge: 2024-10-10 | DRG: 787 | Disposition: A | Discharge status: Home / Self Care | Attending: Obstetrics | Admitting: Obstetrics

## 2024-10-08 ENCOUNTER — Other Ambulatory Visit: Payer: Self-pay

## 2024-10-08 DIAGNOSIS — O9902 Anemia complicating childbirth: Secondary | ICD-10-CM

## 2024-10-08 DIAGNOSIS — O99019 Anemia complicating pregnancy, unspecified trimester: Secondary | ICD-10-CM | POA: Diagnosis present

## 2024-10-08 DIAGNOSIS — Z59869 Financial insecurity, unspecified: Secondary | ICD-10-CM | POA: Diagnosis not present

## 2024-10-08 DIAGNOSIS — D649 Anemia, unspecified: Secondary | ICD-10-CM

## 2024-10-08 DIAGNOSIS — Z3A39 39 weeks gestation of pregnancy: Secondary | ICD-10-CM

## 2024-10-08 DIAGNOSIS — O34211 Maternal care for low transverse scar from previous cesarean delivery: Principal | ICD-10-CM | POA: Diagnosis present

## 2024-10-08 DIAGNOSIS — D62 Acute posthemorrhagic anemia: Secondary | ICD-10-CM | POA: Diagnosis not present

## 2024-10-08 DIAGNOSIS — A599 Trichomoniasis, unspecified: Secondary | ICD-10-CM | POA: Diagnosis not present

## 2024-10-08 DIAGNOSIS — Z833 Family history of diabetes mellitus: Secondary | ICD-10-CM

## 2024-10-08 DIAGNOSIS — O9081 Anemia of the puerperium: Secondary | ICD-10-CM | POA: Diagnosis not present

## 2024-10-08 DIAGNOSIS — A6000 Herpesviral infection of urogenital system, unspecified: Secondary | ICD-10-CM | POA: Diagnosis not present

## 2024-10-08 DIAGNOSIS — O09293 Supervision of pregnancy with other poor reproductive or obstetric history, third trimester: Secondary | ICD-10-CM | POA: Diagnosis not present

## 2024-10-08 DIAGNOSIS — Z4682 Encounter for fitting and adjustment of non-vascular catheter: Secondary | ICD-10-CM | POA: Diagnosis not present

## 2024-10-08 DIAGNOSIS — O9832 Other infections with a predominantly sexual mode of transmission complicating childbirth: Secondary | ICD-10-CM | POA: Diagnosis not present

## 2024-10-08 DIAGNOSIS — O0990 Supervision of high risk pregnancy, unspecified, unspecified trimester: Secondary | ICD-10-CM

## 2024-10-08 DIAGNOSIS — Z8249 Family history of ischemic heart disease and other diseases of the circulatory system: Secondary | ICD-10-CM | POA: Diagnosis not present

## 2024-10-08 DIAGNOSIS — Z8619 Personal history of other infectious and parasitic diseases: Secondary | ICD-10-CM | POA: Diagnosis present

## 2024-10-08 DIAGNOSIS — O09523 Supervision of elderly multigravida, third trimester: Secondary | ICD-10-CM

## 2024-10-08 DIAGNOSIS — O9882 Other maternal infectious and parasitic diseases complicating childbirth: Secondary | ICD-10-CM | POA: Diagnosis not present

## 2024-10-08 DIAGNOSIS — Z98891 History of uterine scar from previous surgery: Principal | ICD-10-CM

## 2024-10-08 DIAGNOSIS — F17210 Nicotine dependence, cigarettes, uncomplicated: Secondary | ICD-10-CM | POA: Diagnosis not present

## 2024-10-08 DIAGNOSIS — O99334 Smoking (tobacco) complicating childbirth: Secondary | ICD-10-CM | POA: Diagnosis not present

## 2024-10-08 DIAGNOSIS — F17200 Nicotine dependence, unspecified, uncomplicated: Secondary | ICD-10-CM | POA: Diagnosis present

## 2024-10-08 SURGERY — Surgical Case
Anesthesia: Spinal

## 2024-10-08 MED ORDER — ONDANSETRON HCL 4 MG/2ML IJ SOLN
INTRAMUSCULAR | Status: DC | PRN
  Administered 2024-10-08 (×2): 4 mg via INTRAVENOUS

## 2024-10-08 MED ORDER — BUPIVACAINE IN DEXTROSE 0.75-8.25 % IT SOLN
INTRATHECAL | Status: DC | PRN
  Administered 2024-10-08: 1.6 mg via INTRATHECAL

## 2024-10-08 MED ORDER — MEPERIDINE HCL 25 MG/ML IJ SOLN: 6.2500 mg | INTRAMUSCULAR | Status: DC | PRN

## 2024-10-08 MED ORDER — BUPIVACAINE HCL (PF) 0.5 % IJ SOLN: 5.0000 mL | Freq: Once | INTRAMUSCULAR | Status: AC

## 2024-10-08 MED ORDER — CHLORHEXIDINE GLUCONATE 0.12 % MT SOLN
15.0000 mL | Freq: Once | OROMUCOSAL | Status: AC
  Administered 2024-10-08: 15 mL via OROMUCOSAL
  Filled 2024-10-08: qty 15

## 2024-10-08 MED ORDER — ACETAMINOPHEN 500 MG PO TABS
1000.0000 mg | ORAL_TABLET | Freq: Four times a day (QID) | ORAL | Status: DC
  Administered 2024-10-08 – 2024-10-10 (×8): 1000 mg via ORAL
  Filled 2024-10-08 (×8): qty 2

## 2024-10-08 MED ORDER — FENTANYL CITRATE (PF) 100 MCG/2ML IJ SOLN
INTRAMUSCULAR | Status: AC
  Filled 2024-10-08: qty 2

## 2024-10-08 MED ORDER — ORAL CARE MOUTH RINSE: 15.0000 mL | Freq: Once | OROMUCOSAL | Status: AC

## 2024-10-08 MED ORDER — IBUPROFEN 800 MG PO TABS
800.0000 mg | ORAL_TABLET | Freq: Four times a day (QID) | ORAL | Status: DC
Start: 2024-10-09 — End: 2024-10-09

## 2024-10-08 MED ORDER — OXYCODONE HCL 5 MG PO TABS
5.0000 mg | ORAL_TABLET | ORAL | Status: DC | PRN
  Administered 2024-10-09: 5 mg via ORAL
  Administered 2024-10-10 (×2): 10 mg via ORAL
  Administered 2024-10-10: 5 mg via ORAL
  Filled 2024-10-08: qty 1
  Filled 2024-10-08 (×2): qty 2
  Filled 2024-10-08: qty 1

## 2024-10-08 MED ORDER — POVIDONE-IODINE 10 % EX SWAB
2.0000 | Freq: Once | CUTANEOUS | Status: DC
  Administered 2024-10-08: 2 via TOPICAL

## 2024-10-08 MED ORDER — FENTANYL CITRATE (PF) 100 MCG/2ML IJ SOLN
INTRAMUSCULAR | Status: DC | PRN
  Administered 2024-10-08: 10 ug via INTRAVENOUS

## 2024-10-08 MED ORDER — LACTATED RINGERS IV SOLN: INTRAVENOUS | Status: DC

## 2024-10-08 MED ORDER — OXYTOCIN-SODIUM CHLORIDE 30-0.9 UT/500ML-% IV SOLN
INTRAVENOUS | Status: AC
  Filled 2024-10-08: qty 500

## 2024-10-08 MED ORDER — COCONUT OIL OIL
1.0000 | TOPICAL_OIL | Status: DC | PRN
  Filled 2024-10-08: qty 7.5

## 2024-10-08 MED ORDER — KETOROLAC TROMETHAMINE 30 MG/ML IJ SOLN: 30.0000 mg | Freq: Four times a day (QID) | INTRAMUSCULAR | Status: DC | PRN

## 2024-10-08 MED ORDER — MORPHINE SULFATE (PF) 0.5 MG/ML IJ SOLN
INTRAMUSCULAR | Status: AC
  Filled 2024-10-08: qty 10

## 2024-10-08 MED ORDER — PRENATAL MULTIVITAMIN CH
1.0000 | ORAL_TABLET | Freq: Every day | ORAL | Status: DC
  Administered 2024-10-10: 1 via ORAL
  Filled 2024-10-08 (×3): qty 1

## 2024-10-08 MED ORDER — ONDANSETRON HCL 4 MG/2ML IJ SOLN
4.0000 mg | Freq: Once | INTRAMUSCULAR | Status: AC
  Administered 2024-10-08: 4 mg via INTRAVENOUS

## 2024-10-08 MED ORDER — BUPIVACAINE ON-Q PAIN PUMP (FOR ORDER SET NO CHG)
INJECTION | Status: DC
  Filled 2024-10-08: qty 1

## 2024-10-08 MED ORDER — ACETAMINOPHEN 500 MG PO TABS
1000.0000 mg | ORAL_TABLET | ORAL | Status: AC
  Administered 2024-10-08: 1000 mg via ORAL
  Filled 2024-10-08: qty 2

## 2024-10-08 MED ORDER — MORPHINE SULFATE (PF) 0.5 MG/ML IJ SOLN
INTRAMUSCULAR | Status: DC | PRN
  Administered 2024-10-08: .1 mg via INTRATHECAL

## 2024-10-08 MED ORDER — SIMETHICONE 80 MG PO CHEW
80.0000 mg | CHEWABLE_TABLET | ORAL | Status: DC | PRN
  Administered 2024-10-08: 80 mg via ORAL

## 2024-10-08 MED ORDER — BUPIVACAINE HCL 0.5 % IJ SOLN
INTRAMUSCULAR | Status: DC | PRN
  Administered 2024-10-08: 10 mL

## 2024-10-08 MED ORDER — DEXAMETHASONE SOD PHOSPHATE PF 10 MG/ML IJ SOLN
INTRAMUSCULAR | Status: DC | PRN
  Administered 2024-10-08: 10 mg via INTRAVENOUS

## 2024-10-08 MED ORDER — CEFAZOLIN SODIUM-DEXTROSE 2-4 GM/100ML-% IV SOLN
2.0000 g | INTRAVENOUS | Status: AC
  Administered 2024-10-08: 2 g via INTRAVENOUS
  Filled 2024-10-08: qty 100

## 2024-10-08 MED ORDER — MENTHOL 3 MG MT LOZG: 1.0000 | LOZENGE | OROMUCOSAL | Status: DC | PRN

## 2024-10-08 MED ORDER — DIBUCAINE (PERIANAL) 1 % EX OINT: 1.0000 | TOPICAL_OINTMENT | CUTANEOUS | Status: DC | PRN

## 2024-10-08 MED ORDER — DIPHENHYDRAMINE HCL 25 MG PO CAPS
25.0000 mg | ORAL_CAPSULE | ORAL | Status: DC | PRN
  Filled 2024-10-08: qty 1

## 2024-10-08 MED ORDER — WITCH HAZEL-GLYCERIN EX PADS: 1.0000 | MEDICATED_PAD | CUTANEOUS | Status: DC | PRN

## 2024-10-08 MED ORDER — SOD CITRATE-CITRIC ACID 500-334 MG/5ML PO SOLN
ORAL | Status: AC
  Filled 2024-10-08: qty 15

## 2024-10-08 MED ORDER — KETOROLAC TROMETHAMINE 30 MG/ML IJ SOLN
30.0000 mg | Freq: Four times a day (QID) | INTRAMUSCULAR | Status: AC
  Administered 2024-10-08 – 2024-10-09 (×4): 30 mg via INTRAVENOUS
  Filled 2024-10-08 (×4): qty 1

## 2024-10-08 MED ORDER — MEDROXYPROGESTERONE ACETATE 150 MG/ML IM SUSP
150.0000 mg | INTRAMUSCULAR | Status: AC | PRN
  Administered 2024-10-10: 150 mg via INTRAMUSCULAR
  Filled 2024-10-08 (×2): qty 1

## 2024-10-08 MED ORDER — SOD CITRATE-CITRIC ACID 500-334 MG/5ML PO SOLN
30.0000 mL | ORAL | Status: AC
  Administered 2024-10-08: 30 mL via ORAL

## 2024-10-08 MED ORDER — SIMETHICONE 80 MG PO CHEW
80.0000 mg | CHEWABLE_TABLET | Freq: Three times a day (TID) | ORAL | Status: DC
  Administered 2024-10-09 – 2024-10-10 (×5): 80 mg via ORAL
  Filled 2024-10-08 (×6): qty 1

## 2024-10-08 MED ORDER — SODIUM CHLORIDE 0.9% FLUSH: 3.0000 mL | INTRAVENOUS | Status: DC | PRN

## 2024-10-08 MED ORDER — NALOXONE HCL 4 MG/10ML IJ SOLN: 1.0000 ug/kg/h | INTRAVENOUS | Status: DC | PRN

## 2024-10-08 MED ORDER — BUPIVACAINE HCL (PF) 0.5 % IJ SOLN
INTRAMUSCULAR | Status: AC
  Filled 2024-10-08: qty 30

## 2024-10-08 MED ORDER — PROPOFOL 10 MG/ML IV BOLUS
INTRAVENOUS | Status: AC
  Filled 2024-10-08: qty 20

## 2024-10-08 MED ORDER — OXYTOCIN-SODIUM CHLORIDE 30-0.9 UT/500ML-% IV SOLN
2.5000 [IU]/h | INTRAVENOUS | Status: AC
  Administered 2024-10-08: 2.5 [IU]/h via INTRAVENOUS
  Filled 2024-10-08: qty 500

## 2024-10-08 MED ORDER — SCOPOLAMINE 1 MG/3DAYS TD PT72
1.0000 | MEDICATED_PATCH | Freq: Once | TRANSDERMAL | Status: DC
  Administered 2024-10-08: 1 mg via TRANSDERMAL
  Filled 2024-10-08: qty 1

## 2024-10-08 MED ORDER — SOD CITRATE-CITRIC ACID 500-334 MG/5ML PO SOLN
ORAL | Status: AC
  Administered 2024-10-08: 30 mL
  Filled 2024-10-08: qty 15

## 2024-10-08 MED ORDER — PHENYLEPHRINE HCL-NACL 20-0.9 MG/250ML-% IV SOLN
INTRAVENOUS | Status: AC
  Filled 2024-10-08: qty 250

## 2024-10-08 MED ORDER — BUPIVACAINE 0.25 % ON-Q PUMP DUAL CATH 400 ML
400.0000 mL | INJECTION | Status: DC
  Filled 2024-10-08: qty 400

## 2024-10-08 MED ORDER — ONDANSETRON HCL 4 MG/2ML IJ SOLN
4.0000 mg | Freq: Three times a day (TID) | INTRAMUSCULAR | Status: DC | PRN
Start: 2024-10-08 — End: 2024-10-10
  Administered 2024-10-08: 4 mg via INTRAVENOUS
  Filled 2024-10-08 (×2): qty 2

## 2024-10-08 MED ORDER — NICOTINE 21 MG/24HR TD PT24: 21.0000 mg | MEDICATED_PATCH | Freq: Every day | TRANSDERMAL | Status: DC | PRN

## 2024-10-08 MED ORDER — ACETAMINOPHEN 500 MG PO TABS: 1000.0000 mg | ORAL_TABLET | Freq: Four times a day (QID) | ORAL | Status: DC

## 2024-10-08 MED ORDER — DIPHENHYDRAMINE HCL 25 MG PO CAPS
25.0000 mg | ORAL_CAPSULE | Freq: Four times a day (QID) | ORAL | Status: DC | PRN
  Administered 2024-10-08 – 2024-10-09 (×2): 25 mg via ORAL
  Filled 2024-10-08: qty 1

## 2024-10-08 MED ORDER — OXYTOCIN-SODIUM CHLORIDE 30-0.9 UT/500ML-% IV SOLN
INTRAVENOUS | Status: DC | PRN
  Administered 2024-10-08: 999 mL/h via INTRAVENOUS

## 2024-10-08 MED ORDER — SENNOSIDES-DOCUSATE SODIUM 8.6-50 MG PO TABS
2.0000 | ORAL_TABLET | Freq: Every day | ORAL | Status: DC
  Administered 2024-10-09 – 2024-10-10 (×2): 2 via ORAL
  Filled 2024-10-08 (×2): qty 2

## 2024-10-08 MED ORDER — LIDOCAINE 5 % EX PTCH
MEDICATED_PATCH | CUTANEOUS | Status: AC
  Filled 2024-10-08: qty 1

## 2024-10-08 MED ORDER — ONDANSETRON HCL 4 MG/2ML IJ SOLN
INTRAMUSCULAR | Status: AC
Start: 2024-10-08 — End: 2024-10-08
  Filled 2024-10-08: qty 2

## 2024-10-08 MED ORDER — NALOXONE HCL 0.4 MG/ML IJ SOLN: 0.4000 mg | INTRAMUSCULAR | Status: DC | PRN

## 2024-10-08 MED ORDER — DIPHENHYDRAMINE HCL 50 MG/ML IJ SOLN: 12.5000 mg | INTRAMUSCULAR | Status: DC | PRN

## 2024-10-08 SURGICAL SUPPLY — 36 items
BAG COUNTER SPONGE SURGICOUNT (BAG) ×1 IMPLANT
BENZOIN TINCTURE PRP APPL 2/3 (GAUZE/BANDAGES/DRESSINGS) IMPLANT
CATH KIT ON-Q SILVERSOAK 5 (CATHETERS) IMPLANT
CHLORAPREP W/TINT 26 (MISCELLANEOUS) ×2 IMPLANT
DERMABOND ADVANCED .7 DNX12 (GAUZE/BANDAGES/DRESSINGS) IMPLANT
DRESSING PEEL AND PLAC PRVNA20 (GAUZE/BANDAGES/DRESSINGS) IMPLANT
DRSG ADAPTIC 3X8 NADH LF (GAUZE/BANDAGES/DRESSINGS) IMPLANT
DRSG TEGADERM 4X4.75 (GAUZE/BANDAGES/DRESSINGS) IMPLANT
DRSG TELFA 3X8 NADH STRL (GAUZE/BANDAGES/DRESSINGS) ×1 IMPLANT
ELECTRODE REM PT RTRN 9FT ADLT (ELECTROSURGICAL) ×1 IMPLANT
GAUZE SPONGE 4X4 12PLY STRL (GAUZE/BANDAGES/DRESSINGS) ×1 IMPLANT
GLOVE BIO SURGEON STRL SZ 6 (GLOVE) ×1 IMPLANT
GLOVE BIOGEL M STER SZ 6 (GLOVE) IMPLANT
GLOVE BIOGEL PI IND STRL 6 (GLOVE) ×1 IMPLANT
GLOVE BIOGEL PI IND STRL 7.0 (GLOVE) IMPLANT
GLOVE SURG SYN 6.5 PF PI BL (GLOVE) IMPLANT
GOWN STRL REUS W/ TWL LRG LVL3 (GOWN DISPOSABLE) ×2 IMPLANT
KIT TURNOVER KIT A (KITS) ×1 IMPLANT
MANIFOLD NEPTUNE II (INSTRUMENTS) ×1 IMPLANT
MAT PREVALON FULL STRYKER (MISCELLANEOUS) ×1 IMPLANT
PACK C SECTION AR (MISCELLANEOUS) ×1 IMPLANT
PAD OB MATERNITY 11 LF (PERSONAL CARE ITEMS) ×1 IMPLANT
PAD PREP OB/GYN DISP 24X41 (PERSONAL CARE ITEMS) ×1 IMPLANT
RETRACTOR TRAXI PANNICULUS (MISCELLANEOUS) IMPLANT
RETRACTOR WND ALEXIS-O 25 LRG (MISCELLANEOUS) IMPLANT
SCRUB CHG 4% DYNA-HEX 4OZ (MISCELLANEOUS) ×1 IMPLANT
SOLN 0.9% NACL POUR BTL 1000ML (IV SOLUTION) ×1 IMPLANT
SUT MNCRL AB 4-0 PS2 18 (SUTURE) ×1 IMPLANT
SUT MON AB 3-0 SH 27 (SUTURE) IMPLANT
SUT VIC AB 0 CT1 36 (SUTURE) ×4 IMPLANT
SUT VIC AB 0 CTX36XBRD ANBCTRL (SUTURE) IMPLANT
SUT VIC AB 3-0 SH 27X BRD (SUTURE) ×1 IMPLANT
SUT VICRYL+ 4-0 18IN PS-4 (SUTURE) IMPLANT
TAPE STRIPS DRAPE STRL (GAUZE/BANDAGES/DRESSINGS) IMPLANT
TRAP FLUID SMOKE EVACUATOR (MISCELLANEOUS) ×1 IMPLANT
WATER STERILE IRR 500ML POUR (IV SOLUTION) ×1 IMPLANT

## 2024-10-08 NOTE — Transfer of Care (Signed)
 Immediate Anesthesia Transfer of Care Note  Patient: Cynthia Davies  Procedure(s) Performed: CESAREAN DELIVERY  Patient Location: PACU  Anesthesia Type:Spinal  Level of Consciousness: awake, alert , and oriented  Airway & Oxygen Therapy: Patient Spontanous Breathing  Post-op Assessment: Report given to RN and Post -op Vital signs reviewed and stable  Post vital signs: stable, HOB raised to pt's preference. She is feeling nauseated and it's beng treated  Last Vitals:  Vitals Value Taken Time  BP    Temp    Pulse    Resp    SpO2      Last Pain:  Vitals:   10/08/24 0756  TempSrc: Oral         Complications: No notable events documented.

## 2024-10-08 NOTE — Anesthesia Procedure Notes (Signed)
 Spinal  Start time: 10/08/2024 7:50 AM End time: 10/08/2024 8:09 AM Staffing Performed: other anesthesia staff  Anesthesiologist: Mazzoni, Andrea, MD Other anesthesia staff: Estelle Merck, Hipolito, RN Performed by: Shellie Odor, MD Authorized by: Shellie Odor, MD   Preanesthetic Checklist Completed: patient identified, IV checked, site marked, risks and benefits discussed, surgical consent, monitors and equipment checked, pre-op evaluation and timeout performed Spinal Block Patient position: sitting Prep: ChloraPrep Patient monitoring: heart rate, cardiac monitor, blood pressure and continuous pulse ox Approach: midline Location: L3-4 Injection technique: single-shot Assessment Sensory level: T4 Events: CSF return
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
## 2024-10-08 NOTE — Anesthesia Preprocedure Evaluation (Addendum)
 Anesthesia Evaluation  Patient identified by MRN, date of birth, ID band Patient awake    Reviewed: Allergy & Precautions, NPO status , Patient's Chart, lab work & pertinent test results  History of Anesthesia Complications Negative for: history of anesthetic complications  Airway Mallampati: II   Neck ROM: Full    Dental  (+) Missing, Loose, Chipped   Pulmonary Current Smoker (1/2 ppd) and Patient abstained from smoking.   Pulmonary exam normal breath sounds clear to auscultation       Cardiovascular Exercise Tolerance: Good negative cardio ROS Normal cardiovascular exam Rhythm:Regular Rate:Normal     Neuro/Psych negative neurological ROS     GI/Hepatic negative GI ROS,,,  Endo/Other  negative endocrine ROS    Renal/GU negative Renal ROS     Musculoskeletal   Abdominal   Peds  Hematology  (+) Blood dyscrasia, anemia   Anesthesia Other Findings 38 yo [id number] presenting for repeat c-section.  Reproductive/Obstetrics (+) Pregnancy                              Anesthesia Physical Anesthesia Plan  ASA: 2  Anesthesia Plan: Spinal   Post-op Pain Management:    Induction:   PONV Risk Score and Plan: 1 and Ondansetron , Dexamethasone and Treatment may vary due to age or medical condition  Airway Management Planned: Natural Airway and Nasal Cannula  Additional Equipment:   Intra-op Plan:   Post-operative Plan:   Informed Consent: I have reviewed the patients History and Physical, chart, labs and discussed the procedure including the risks, benefits and alternatives for the proposed anesthesia with the patient or authorized representative who has indicated his/her understanding and acceptance.     Dental Advisory Given  Plan Discussed with: CRNA  Anesthesia Plan Comments: (Patient reports no bleeding problems and no anticoagulant use.  Plan for spinal with backup  GA.  Patient consented for risks of anesthesia including but not limited to:  - adverse reactions to medications - damage to eyes, teeth, lips or other oral mucosa - nerve damage due to positioning  - risk of bleeding, infection and or nerve damage from spinal that could lead to paralysis - risk of headache or failed spinal - damage to teeth, lips or other oral mucosa - sore throat or hoarseness - damage to heart, brain, nerves, lungs, other parts of body or loss of life  Patient voiced understanding and assent.)         Anesthesia Quick Evaluation

## 2024-10-08 NOTE — Progress Notes (Signed)
 Pt to SCN to visit and feed infant via bed, report to Nola Euler, RN

## 2024-10-08 NOTE — Op Note (Signed)
 CESAREAN SECTION OPERATIVE REPORT   DATE OF SURGERY: 10/08/24  SURGEON: Estil Mangle, DO ASSISTANT:  Slater Lynda HOWARD; Rosina Hamilton, CNM-Student ANESTHESIA: Spinal  PROCEDURE: Repeat low transverse cesarean section  PREOPERATIVE DIAGNOSES: 1. Intrauterine pregnancy at [redacted]w[redacted]d 2. History of previous cesarean section x 2 3. Anemia of pregnancy 4. Tobacco use in pregnancy 5. Hx of prior syphilis infection, s/p treatment in 2024  POSTOPERATIVE DIAGNOSES: 1. Same, s/p rLTCS  QBL:  485cc DRAINS: foley catheter to gravity drainage, 45 ml of clear urine at end of the procedure TOTAL IV FLUIDS: SPECIMENS: Cord blood COMPLICATIONS:  None  FINDINGS:  Viable female infant in cephalic presentation; vigorous at birth, APGARs 8/8; weight 3710 grams (8lbs, 3oz) Clear fluid at amniotomy Intact placenta with 3 vessel cord Uterus, tubes, and ovaries appeared normal  INDICATION and CONSENT: Cynthia Davies is a 38 y.o. H4E7977 with IUP at [redacted]w[redacted]d presenting for scheduled repeat cesarean. The patient understood that the risks of cesarean section include, but are not limited to, visceral or vascular injury, infection, blood loss and need for transfusion, prolonged hospitalization, and reoperation.  The patient stated understanding and desired to proceed.  All questions were answered.  PROCEDURE:  After verbal and written informed consent was obtained, the patient was taken to the operating room where spinal anesthesia was found to be adequate.  SCDs were applied to the lower extremities and a Foley catheter was placed in the bladder under sterile technique.  The patient was placed in dorsal supine position with a leftward tilt, prepped and draped in a sterile fashion.  Two grams of Cefazolin were given for infection prophylaxis.  Level of anesthesia was confirmed to be adequate with Allis clamps.  A Pfannenstiel skin incision was made with the scalpel and carried down to the underlying layer of  rectus fascia.  The fascia was nicked bilaterally in the midline with the scalpel and the fascial incision was extended laterally using Mayo scissors and the Bovie.  There were significant adhesions between the fascia and rectus muscles. The superior aspect of the fascia was grasped with Kocher clamps and the underlying rectus muscles were dissected off sharply with Mayo scissors and bluntly.  In a similar fashion, the inferior aspect of fascia was grasped with Kocher clamps and the underlying rectus and pyramidalis were dissected off sharply and bluntly.  The rectus muscles were separated in the midline sharply.  The peritoneum was found to be free of adherent bowel and entered sharply.  The peritoneal incision was extended bluntly to the bladder reflection with good visualization of the bladder.  The Alexis retractor was inserted and vesicouterine peritoneum identified.  Intraabdomnial survey revealed scant, clear peritoneal fluid and a thinned-out lower uterine segment.  The vesicouterine peritoneum was adhered to the lower segment and was opened with Metzenbaum scissors and the bladder flap was developed.  The lower uterine segment was incised transversely with the scalpel.  The amniotic sac was ruptured with an Allis clamp and clear fluid noted.  The uterine incision was extended bluntly in a cranial-caudal fashion.  The fetus was in cephalic presentation.  The head was flexed and elevated to the level of the uterine incision.  Gentle fundal pressure was applied by the assistant and the infant was delivered without difficulty.  There was a loose nuchal x 1, easily reduced. The nose and mouth were suctioned with a bulb. Delayed cord clamping was performed for approximately 60 seconds. The infant was shown to the patient. The cord was  doubly clamped and cut and cord blood obtained.  The infant was handed to the awaiting NICU team.  The placenta delivered intact & spontaneously with manual massage of the  uterine fundus. The uterus was left in situ.  The inside of the uterus was gently wiped with lap sponges x 2 ensuring complete removal of placental membranes. The uterine incision was repaired with a double layer closure of 0-Vicryl first in a locking fashion, followed by 0-Vicryl in an imbricating stitch, with excellent hemostasis achieved.  The ovaries and tubes were found to be grossly normal.  Blood clots, debris and fluid were cleaned from the abdomen, gutters, and pelvis with moist laparotomy sponges.  The uterine incision was reinspected and was hemostatic.   The On-Q catheter pumps were inserted in accordance with the manufacturer's recommendations.  The catheters were inserted approximately 4cm cephelad to the incision line, approximately 1cm apart, straddling the midline.  They were inserted to a depth of the 4th mark. They were positioned superficial to the rectus abdominus muscles and deep to the rectus fascia.    The superior and inferior fascia were grasped with Kocher clamps and the rectus muscles were examined and found to be hemostatic, ensured with Bovie electrocautery.  The peritoneum not closed with 3-0 Monocryl in a continuous, running fashion. The fascial layer was closed with 0-Vicryl in a running fashion.  The subcutaneous tissue was irrigated, made hemostatic with Bovie electrocautery, and was only 1cm deep, not reapproximated with suture. The skin was closed subcuticularly with 4-0 Vicryl on a keith and steristrips and a sterile pressure dressing was placed.  The On-Q catheters were bolused with 5 mL of 0.5% marcaine plain for a total of 10 mL.  The catheters were affixed to the skin with surgical skin glue, steri-strips, and tegaderm.  All sponge, lap and instrument counts were correct x 2. The patient tolerated the procedure well and was taken to the recovery room in stable condition.  An experienced assistant was required given the standard of surgical care given the complexity of  the case.  This assistant was needed for exposure, dissection, suctioning, retraction, instrument exchange, and for overall help during the procedure.   Estil Mangle, DO [facility]

## 2024-10-09 ENCOUNTER — Encounter: Payer: Self-pay | Admitting: Obstetrics

## 2024-10-09 LAB — CBC
HCT: 26.6 % — ABNORMAL LOW (ref 36.0–46.0)
Hemoglobin: 8.8 g/dL — ABNORMAL LOW (ref 12.0–15.0)
MCH: 29.5 pg (ref 26.0–34.0)
MCHC: 33.1 g/dL (ref 30.0–36.0)
MCV: 89.3 fL (ref 80.0–100.0)
Platelets: 213 K/uL (ref 150–400)
RBC: 2.98 MIL/uL — ABNORMAL LOW (ref 3.87–5.11)
RDW: 13.4 % (ref 11.5–15.5)
WBC: 11.8 K/uL — ABNORMAL HIGH (ref 4.0–10.5)
nRBC: 0 % (ref 0.0–0.2)

## 2024-10-09 MED ORDER — SODIUM CHLORIDE 0.9 % IV SOLN: INTRAVENOUS | Status: AC | PRN

## 2024-10-09 MED ORDER — IBUPROFEN 800 MG PO TABS
800.0000 mg | ORAL_TABLET | Freq: Three times a day (TID) | ORAL | Status: DC | PRN
  Administered 2024-10-10: 800 mg via ORAL
  Filled 2024-10-09: qty 1

## 2024-10-09 MED ORDER — IRON SUCROSE 500 MG IVPB - SIMPLE MED
500.0000 mg | Freq: Once | INTRAVENOUS | Status: AC
  Administered 2024-10-09: 500 mg via INTRAVENOUS
  Filled 2024-10-09: qty 500

## 2024-10-09 MED ORDER — ALBUTEROL SULFATE (2.5 MG/3ML) 0.083% IN NEBU: 2.5000 mg | INHALATION_SOLUTION | Freq: Once | RESPIRATORY_TRACT | Status: DC | PRN

## 2024-10-09 MED ORDER — EPINEPHRINE 0.3 MG/0.3ML IJ SOAJ: 0.3000 mg | Freq: Once | INTRAMUSCULAR | Status: DC | PRN

## 2024-10-09 MED ORDER — DIPHENHYDRAMINE HCL 50 MG/ML IJ SOLN: 25.0000 mg | Freq: Once | INTRAMUSCULAR | Status: DC | PRN

## 2024-10-09 MED ORDER — FERROUS SULFATE 325 (65 FE) MG PO TABS
325.0000 mg | ORAL_TABLET | Freq: Every day | ORAL | Status: DC
  Administered 2024-10-10: 325 mg via ORAL
  Filled 2024-10-09: qty 1

## 2024-10-09 MED ORDER — SODIUM CHLORIDE 0.9 % IV BOLUS: 500.0000 mL | Freq: Once | INTRAVENOUS | Status: DC | PRN

## 2024-10-09 MED ORDER — METHYLPREDNISOLONE SODIUM SUCC 125 MG IJ SOLR: 125.0000 mg | Freq: Once | INTRAMUSCULAR | Status: DC | PRN

## 2024-10-09 NOTE — Progress Notes (Signed)
 Pt noted to be co-sleeping with baby x 2 after being advised about the importance of safe sleep. Pt educated on safe sleep (back, alone, nothing else in the crib).

## 2024-10-09 NOTE — Lactation Note (Signed)
 This note was copied from a baby's chart. Lactation Consultation Note  Patient Name: Cynthia Davies Today's Date: 10/09/2024 Age:37 hours     Lactation Note:  LC to mother's room to introduce myself and offer breastfeeding support and education. Care nurse in attendance at bedside at this time. Mom reports baby ate not long ago and per mom she is tired. LC provided information(LC phone number on white board) for mom to call LC or call the care nurse to let [facility] know when the baby is ready to feed and LC will return to assist as needed with breastfeeding. LC checked baby in 3 hours from the previous feeding and mom and baby are asleep. Per care nurse last feeding baby took 51 ml's. LC to check back in 1 hour to see if baby is ready to feed and assist mom as needed. LC back to room. At this time mom asleep in the bed. Baby in the base of mom's bed along side mom's leg sucking on the pacifier. LC attempted to awaken mom and mom loudly stated, What! LC let mom know LC was present to assist with breastfeeding however if mom does not want to breastfeed or doesn't feel she needs help that is ok. Mom states, I don't know what I want to do! LC updated care nurse and NP regarding mother declining breastfeeding assistance and that mother is unsure about her feeding plan. Also, let care nurse and NP know baby is sleeping in the base of the bed along side the mother's leg while mom is sleeping. After update care nurse to mother's room. LC will not return to mother's room at this time unless mother requests LC assistance. Mom has LC phone number and can also call care nurse for assistance as mentioned this morning at the first follow-up visit.  Consult Status  Inpatient PRN 10/09/24   Cynthia Davies 10/09/2024, 6:16 PM

## 2024-10-09 NOTE — Anesthesia Postprocedure Evaluation (Signed)
 Anesthesia Post Note  Patient: Cynthia Davies  Procedure(s) Performed: CESAREAN DELIVERY  Patient location during evaluation: Mother Baby Anesthesia Type: Spinal Level of consciousness: oriented and awake and alert Pain management: pain level controlled Vital Signs Assessment: post-procedure vital signs reviewed and stable Respiratory status: spontaneous breathing and respiratory function stable Cardiovascular status: blood pressure returned to baseline and stable Postop Assessment: no headache, no backache, no apparent nausea or vomiting and able to ambulate Anesthetic complications: no   There were no known notable events for this encounter.   Last Vitals:  Vitals:   10/08/24 2000 10/08/24 2316  BP: 111/76 118/79  Pulse: 71 64  Resp: 18 18  Temp: 36.7 C 36.9 C  SpO2: 98% 100%    Last Pain:  Vitals:   10/09/24 0638  TempSrc:   PainSc: 1                  [name]

## 2024-10-09 NOTE — Progress Notes (Addendum)
 Subjective: Postpartum Day 1: Cesarean Delivery Cynthia Davies is feeling well overall. She is ambulating, voiding, and tolerating POs without difficulty. Her pain is well-controlled and her bleeding is WNL. Her mood is stable. She is struggling a bit with breastfeeding. She is feeling a little lightheaded when she gets up. She denies SOB.  Objective: Vital signs in last 24 hours: Temp:  [97.8 F (36.6 C)-98.5 F (36.9 C)] 98.3 F (36.8 C) (10/30 0837) Pulse Rate:  [54-73] 73 (10/30 0837) Resp:  [9-18] 18 (10/30 0837) BP: (111-125)/(73-87) 117/73 (10/30 0837) SpO2:  [95 %-100 %] 99 % (10/30 0837)  Physical Exam:  General: alert and cooperative Lochia: appropriate Uterine Fundus: firm Incision: pressure dressing in place, C/D/I DVT Evaluation: No evidence of DVT seen on physical exam.  Recent Labs    10/06/24 1313 10/09/24 0544  HGB 10.2* 8.8*  HCT 30.6* 26.6*    Assessment/Plan: Status post Cesarean section. Doing well postoperatively.  Iron infusion today Lactation assistance Remove pressure dressing today Depo before d/c Anticipate discharge in 1-2 days  Cynthia Davies, CNM 10/09/2024, 10:05 AM

## 2024-10-09 NOTE — Progress Notes (Signed)
 Pt received orders for an IV iron transfusion. About half-way through the administration, the  pt developed mild chest pain. Infusion stopped and pt reported resolution of her symptoms within 1 hours. Midwife, M swanson notified. No new orders.

## 2024-10-10 ENCOUNTER — Encounter: Payer: Self-pay | Admitting: Obstetrics

## 2024-10-10 ENCOUNTER — Other Ambulatory Visit: Payer: Self-pay

## 2024-10-10 MED ORDER — ACETAMINOPHEN 500 MG PO TABS
1000.0000 mg | ORAL_TABLET | Freq: Four times a day (QID) | ORAL | 1 refills | Status: AC
  Filled 2024-10-10: qty 60, 8d supply, fill #0

## 2024-10-10 MED ORDER — FERROUS SULFATE 325 (65 FE) MG PO TABS
325.0000 mg | ORAL_TABLET | Freq: Every day | ORAL | 3 refills | Status: AC
  Filled 2024-10-10: qty 90, 90d supply, fill #0

## 2024-10-10 MED ORDER — IBUPROFEN 800 MG PO TABS
800.0000 mg | ORAL_TABLET | Freq: Three times a day (TID) | ORAL | 1 refills | Status: AC | PRN
  Filled 2024-10-10: qty 60, 20d supply, fill #0

## 2024-10-10 MED ORDER — MEDROXYPROGESTERONE ACETATE 150 MG/ML IM SUSP: 150.0000 mg | INTRAMUSCULAR | Status: AC

## 2024-10-10 MED ORDER — SIMETHICONE 80 MG PO CHEW
80.0000 mg | CHEWABLE_TABLET | ORAL | 0 refills | Status: AC | PRN
  Filled 2024-10-10: qty 30, 30d supply, fill #0

## 2024-10-10 MED ORDER — SENNOSIDES-DOCUSATE SODIUM 8.6-50 MG PO TABS
2.0000 | ORAL_TABLET | Freq: Every day | ORAL | 0 refills | Status: AC
  Filled 2024-10-10: qty 30, 15d supply, fill #0

## 2024-10-10 MED ORDER — FAMOTIDINE 20 MG PO TABS
20.0000 mg | ORAL_TABLET | Freq: Every day | ORAL | Status: DC
  Administered 2024-10-10: 20 mg via ORAL
  Filled 2024-10-10: qty 1

## 2024-10-10 MED ORDER — OXYCODONE HCL 5 MG PO TABS
5.0000 mg | ORAL_TABLET | ORAL | 0 refills | Status: AC | PRN
  Filled 2024-10-10: qty 20, 2d supply, fill #0

## 2024-10-10 NOTE — Discharge Instructions (Signed)

## 2024-10-10 NOTE — Discharge Summary (Signed)
 Postpartum Discharge Summary      Patient Name: Cynthia Davies DOB: 05-21-1986 MRN: [id number]  Date of admission: 10/08/2024 Delivery date:10/08/2024 Delivering provider: LEIGH SOBER Date of discharge: 10/10/2024  Admitting diagnosis: History of cesarean delivery [Z98.891] Pregnancy [Z34.90] Intrauterine pregnancy: [redacted]w[redacted]d     Secondary diagnosis:  Principal Problem:   Status post repeat low transverse cesarean section Active Problems:   History of herpes genitalis   Smoker 1/2 ppd   Supervision of high risk pregnancy, antepartum   Anemia during pregnancy   Postpartum care following cesarean delivery  Additional problems: none    Discharge diagnosis: Term Pregnancy Delivered                                              Post partum procedures:none Augmentation: N/A Complications: None  Hospital course: Sceduled C/S   38 y.o. yo H4E7977 at [redacted]w[redacted]d was admitted to the hospital 10/08/2024 for scheduled cesarean section with the following indication:Elective Repeat.Delivery details are as follows:  Membrane Rupture Time/Date: 8:38 AM,10/08/2024  Delivery Method:C-Section, Low Transverse Operative Delivery:N/A Details of operation can be found in separate operative note.  Patient had a postpartum course complicated by anemia & IV iron infusion reaction.  She is ambulating, tolerating a regular diet, passing flatus, and urinating well. Patient is discharged home in stable condition on  10/10/24        Newborn Data: Birth date:10/08/2024 Birth time:8:39 AM Gender:Female Living status:Living Apgars:8 ,8  Weight:3170 g    Magnesium Sulfate received: No BMZ received: No Rhophylac:N/A MMR:N/A T-DaP:Given prenatally Flu: declined RSV Vaccine received: No Transfusion:No Immunizations administered: Immunization History  Administered Date(s) Administered   MMR 12/31/1991   Tdap 12/28/2015, 07/22/2024   Varicella 09/10/2007    Recommend 6 weeks of prophylactic anticoagulation  with LMWH or subcutaneous unfractionated heparin if 1 or more high risk factor is present.  Recommend 14 days of prophylactic anticoagulation with LMWH or subcutaneous unfractionated heparin if 3 or more moderate risk factors are present.   Risk assessment for postpartum VTE and prophylactic treatment:  High risk factors: None Moderate risk factors: Cesarean delivery   Postpartum VTE prophylaxis with LMWH not indicated   Physical exam  Vitals:   10/09/24 1347 10/09/24 1542 10/09/24 2315 10/10/24 0747  BP: 102/75 137/79 118/65 96/60  Pulse: 64 68 64 64  Resp: 16 18 20 18   Temp: 98.9 F (37.2 C) 98.6 F (37 C) 98.5 F (36.9 C) 98.1 F (36.7 C)  TempSrc: Oral Oral Oral Oral  SpO2: 99%  100% 98%  Weight:      Height:       General: alert, cooperative, and no distress Lochia: appropriate Uterine Fundus: firm Incision: Healing well with no significant drainage, No significant erythema, Dressing is clean, dry, and intact DVT Evaluation: No evidence of DVT seen on physical exam. Labs: Lab Results  Component Value Date   WBC 11.8 (H) 10/09/2024   HGB 8.8 (L) 10/09/2024   HCT 26.6 (L) 10/09/2024   MCV 89.3 10/09/2024   PLT 213 10/09/2024      Latest Ref Rng & Units 04/07/2024   11:41 AM  CMP  Glucose 70 - 99 mg/dL 895   BUN 6 - 20 mg/dL 6   Creatinine 9.55 - 8.99 mg/dL 9.35   Sodium 864 - 854 mmol/L 133   Potassium 3.5 - 5.1 mmol/L 3.9  Chloride 98 - 111 mmol/L 104   CO2 22 - 32 mmol/L 23   Calcium 8.9 - 10.3 mg/dL 8.3    Edinburgh Score:    10/09/2024    3:56 PM  Edinburgh Postnatal Depression Scale Screening Tool  I have been able to laugh and see the funny side of things. 0  I have looked forward with enjoyment to things. 0  I have blamed myself unnecessarily when things went wrong. 1  I have been anxious or worried for no good reason. 0  I have felt scared or panicky for no good reason. 0  Things have been getting on top of me. 1  I have been so unhappy that  I have had difficulty sleeping. 0  I have felt sad or miserable. 0  I have been so unhappy that I have been crying. 0  The thought of harming myself has occurred to me. 0  Edinburgh Postnatal Depression Scale Total 2       After visit meds:  Allergies as of 10/10/2024   No Known Allergies      Medication List     STOP taking these medications    fluconazole  150 MG tablet Commonly known as: Diflucan        TAKE these medications    Acetaminophen  Extra Strength 500 MG Tabs Take 2 tablets (1,000 mg total) by mouth every 6 (six) hours.   FeroSul 325 (65 Fe) MG tablet Generic drug: ferrous sulfate  Take 1 tablet (325 mg total) by mouth daily with breakfast.   ibuprofen  800 MG tablet Commonly known as: ADVIL  Take 1 tablet (800 mg total) by mouth every 8 (eight) hours as needed for mild pain (pain score 1-3).   medroxyPROGESTERone  150 MG/ML injection Commonly known as: DEPO-PROVERA  Inject 1 mL (150 mg total) into the muscle every 3 (three) months.   multivitamin-prenatal 27-0.8 MG Tabs tablet Take 1 tablet by mouth daily at 12 noon.   nicotine  21 mg/24hr patch Commonly known as: Nicoderm CQ  Place 1 patch (21 mg total) onto the skin daily.   oxyCODONE  5 MG immediate release tablet Commonly known as: Oxy IR/ROXICODONE  Take 1-2 tablets (5-10 mg total) by mouth every 4 (four) hours as needed for moderate pain (pain score 4-6).   simethicone  80 MG chewable tablet Commonly known as: MYLICON Chew 1 tablet (80 mg total) by mouth as needed for flatulence.   Stool Softener/Laxative 50-8.6 MG tablet Generic drug: senna-docusate Take 2 tablets by mouth daily.   valACYclovir 500 MG tablet Commonly known as: Valtrex Take 1 tablet (500 mg total) by mouth daily.         Discharge home in stable condition Infant Feeding: Bottle Infant Disposition:home with mother Discharge instruction: per After Visit Summary and Postpartum booklet. Activity: Advance as tolerated.  Pelvic rest for 6 weeks.  Diet: routine diet Anticipated Birth Control: Depo Postpartum VTE prophyhlaxis: Not Indicated Postpartum Appointment:6 weeks Additional Postpartum F/U: Incision check 1 week Future Appointments: Future Appointments  Date Time Provider Department Center  10/15/2024  2:35 PM Leigh Sober, MD [facility] None   Follow up Visit:  Follow-up Information     Leigh Sober, MD. Schedule an appointment as soon as possible for a visit in 1 week(s).   Specialties: Obstetrics, Gynecology Why: Incision check-scheduled 10/15/2024 Contact information: [address] [place] [address] [phone]         [facility]. Schedule an appointment as soon as possible for a visit in 6 week(s).  Specialty: Obstetrics and Gynecology Why: Postpartum visit Contact information: [address] [place]  [phone]                    10/10/2024 Harlene LITTIE Cisco, CNM

## 2024-10-10 NOTE — Progress Notes (Signed)
 Re-educated pt on the importance of safe sleep and educated the pt on infant swaddling to promote independent sleep. Pt verbalized understanding and reports having a bassinet at home where the infant will sleep. Baby currently swaddled and sleeping alone in the bassinet. No other concerns.

## 2024-10-10 NOTE — Final Progress Note (Signed)
 Obstetric Postpartum/PostOperative Daily Progress Note Subjective:  38 y.o. H4E7977 post-operative day # 2 status post repeat cesarean delivery.  She is ambulating, is tolerating po, is voiding spontaneously.  Her pain is well controlled on PO pain medications. Her lochia is less than menses. She reported intermittent pain with deep breaths which has now resolved after oxycodone  & single dose famotidine .     Medications SCHEDULED MEDICATIONS   acetaminophen   1,000 mg Oral Q6H   famotidine   20 mg Oral Daily   ferrous sulfate   325 mg Oral Q breakfast   prenatal multivitamin  1 tablet Oral Q1200   scopolamine  1 patch Transdermal Once   senna-docusate  2 tablet Oral Daily   simethicone   80 mg Oral TID PC    MEDICATION INFUSIONS   bupivacaine 0.25 % ON-Q pump DUAL CATH 400 mL     bupivacaine ON-Q pain pump     naloxone HCl (NARCAN) 2 mg in dextrose 5 % 250 mL infusion     sodium chloride       PRN MEDICATIONS  albuterol, coconut oil, witch hazel-glycerin **AND** dibucaine, diphenhydrAMINE  **OR** diphenhydrAMINE , diphenhydrAMINE , diphenhydrAMINE , EPINEPHrine, [COMPLETED] ketorolac  **FOLLOWED BY** ibuprofen , medroxyPROGESTERone , menthol, methylPREDNISolone (SOLU-MEDROL) injection, naloxone **AND** sodium chloride  flush, naloxone HCl (NARCAN) 2 mg in dextrose 5 % 250 mL infusion, nicotine , ondansetron  (ZOFRAN ) IV, oxyCODONE , simethicone , sodium chloride     Objective:   Vitals:   10/09/24 1347 10/09/24 1542 10/09/24 2315 10/10/24 0747  BP: 102/75 137/79 118/65 96/60  Pulse: 64 68 64 64  Resp: 16 18 20 18   Temp: 98.9 F (37.2 C) 98.6 F (37 C) 98.5 F (36.9 C) 98.1 F (36.7 C)  TempSrc: Oral Oral Oral Oral  SpO2: 99%  100% 98%  Weight:      Height:        Current Vital Signs 24h Vital Sign Ranges  T 98.1 F (36.7 C) Temp  Avg: 98.5 F (36.9 C)  Min: 98.1 F (36.7 C)  Max: 98.9 F (37.2 C)  BP 96/60 BP  Min: 96/60  Max: 137/79  HR 64 Pulse  Avg: 65  Min: 64  Max: 68  RR 18  Resp  Avg: 18  Min: 16  Max: 20  SaO2 98 % Room Air SpO2  Avg: 99 %  Min: 98 %  Max: 100 %       24 Hour I/O Current Shift I/O  Time Ins Outs 10/30 0701 - 10/31 0700 In: 533.3 [P.O.:360] Out: 425 [Urine:425] No intake/output data recorded.  General: NAD Pulmonary: no increased work of breathing Abdomen: non-distended, non-tender, fundus firm at level of umbilicus Inc: Clean/dry/intact Extremities: no edema, no erythema, no tenderness  Labs:  Recent Labs  Lab 10/06/24 1313 10/09/24 0544  WBC 6.6 11.8*  HGB 10.2* 8.8*  HCT 30.6* 26.6*  PLT 222 213     Assessment:   38 y.o. H4E7977 postoperative day # 2 status post repeat cesarean section  Plan:  1) Acute blood loss anemia - hemodynamically stable and asymptomatic, significant for admission. IV Fe started 10/30 but [facility] had reaction to infusion & it was discontinued. Continue po iron supplement. - po ferrous sulfate   2) O POS / Rubella 5.22 (04/21 1039)/ Varicella Immune  3) TDAP status received prenatally, 07/22/24; Flu vaccine declined  4) bottle /Contraception = Depo-Provera   5) Disposition: discharge home today. Support at home 17yo daughter, Kevina with flat affect per RN yesterday, today interacting appropriately with this writer & attentive to baby.  Harlene LITTIE Cisco,  CNM

## 2024-10-10 NOTE — Lactation Note (Signed)
 This note was copied from a baby's chart. Lactation Consultation Note  Patient Name: Cynthia Davies Unijb'd Date: 10/10/2024 Age:37 hours Reason for consult: Mother's request;Early term 37-38.6wks;Other (Comment) (Discharge Education)   Maternal Data Lactation called to room, patient endorsing pain w/ using the manual pump.  Mom expressed that her breast are firm.  Feeding Mother's Current Feeding Choice: Breast Milk and Formula  Lactation Tools Discussed/Used Mom using a 24mm flange.  LC provided patient a 21mm flange, and coconut oil.  The pain patient is experiencing is from the strong tug at the breast.  LC recommended mom ice breast prior to pumping for 15 - 20 minutes then attempt pumping.  Interventions Interventions: Hand express;Hand pump;Ice;Education  Reverse pressuring was also taught to patient for edema noticed in areola.    Discharge Discharge Education: Engorgement and breast care;Outpatient recommendation  Discussed engorgement and prevention/treatment.  Outpatient lactation phone number wrote on whiteboard for assistance.  Consult Status Consult Status: Complete Follow-up type: Call as needed    Braeley Buskey S Charron Coultas 10/10/2024, 2:52 PM

## 2024-10-10 NOTE — Clinical Social Work Maternal (Signed)
  CLINICAL SOCIAL WORK MATERNAL/CHILD NOTE  Patient Details  Name: Cynthia Davies MRN: [id number] Date of Birth: [birth year]  Date:  10/10/2024  Clinical Social Worker Initiating Note:  Pavneet Markwood LCSW Date/Time: Initiated:  10/10/24/      Child's Name:  Editha Dove   Biological Parents:  Mother   Need for Interpreter:  None   Reason for Referral:  Other (Comment)   Address:  [address] [place] [address]    Phone number:  [phone] (home)     Additional phone number: N/A  Household Members/Support Persons (HM/SP):       HM/SP Name Relationship DOB or Age  HM/SP -1        HM/SP -2        HM/SP -3        HM/SP -4        HM/SP -5        HM/SP -6        HM/SP -7        HM/SP -8          Natural Supports (not living in the home):      Professional Supports:     Employment: Unemployed   Type of Work:     Education:  Engineer, agricultural   Homebound arranged:    Surveyor, Quantity Resources:  [employer]   Other Resources:  Sales Executive     Cultural/Religious Considerations Which May Impact Care:  N/A  Strengths:  Compliance with medical plan  , Home prepared for child  , Pediatrician chosen   Psychotropic Medications:         Pediatrician:    [employer] area  Pediatrician List:   [facility] [place]    [place]    [place] [facility]      Pediatrician Fax Number:    Risk Factors/Current Problems:      Cognitive State:  Alert     Mood/Affect:  Flat     CSW Assessment: CSW met with patient. CSW observed baby in bed with patient. RN documented this concern regarding safe sleeping. Patient was awake and alert. Baby was asleep.   CSW reviewed HIPAA with patient. Her 37 year old daughter was also in the room with her. CSW provided resources for safe sleep. MOB reports having a car seat. The mgm will assist mother and provide transportation to the home.   FOB is currently incarcerated  but he discharges to the home in December. MOB states she has necessary baby supplies at home re: Bassinet, diapers, bottles etc. She stated that she can not use the bottles she currently has because they are not for newborns. She stated that her daughter is going to [place], today to purchase bottles.   Patient was flat in affect but that may be her baseline. She was very friendly and approachable. CSW had no concerns for immanent safety and/or risk. Resources were handed to the mother and uploaded to the AVS as well.   CSW Plan/Description:  Other, No Further Intervention Required/No Barriers to Discharge    Keyvin Rison L Aldan Camey, LCSW 10/10/2024, 11:44 AM

## 2024-10-14 NOTE — Progress Notes (Unsigned)
   Postoperative Cesarean Incision Check Cynthia Davies is a 38 y.o. [phone] s/p repeat LTCS  at [redacted]w[redacted]d for history of previous c-section, POD#7, here today for incision check.  Subjective: {PAIN CONTROL:13522::The patient is not having any pain.} She denies {Blank multiple:19196::fever,chills,nausea,vomiting}. Eating a regular diet {WITH-WITHOUT:5700} difficulty.  Is***not having regular bowel movements. Activity: {history; activity/diet:30389}. Bleeding is ***. She {Actions; denies/admits to:5300} issues with her incision.    Objective: There were no vitals taken for this visit. There is no height or weight on file to calculate BMI.  General:  alert and no distress  Abdomen: soft, bowel sounds active, non-tender  Incision:   {incision:13716::no dehiscence,incision well approximated,healing well,no drainage,no erythema,no hernia,no seroma,no swelling}    Assessment/Plan: Cynthia Davies is a 38 y.o. [phone] s/p repeat LTCS at [redacted]w[redacted]d for history previous c/s, POD#7, here today for incision check, healing well. No concerns with incision today, remaining steri-strips removed and replaced.  -Discussed at-home care, healing expectations, si/sx of infection.  -Call clinic if developing redness, discharge, or increasing pain. -Avoid vigorous scrubbing/washing of incision site; hygiene reviewed. -May trim back steri-strips if they peel; should fully remove after 1 week from today. -May resume driving and light walking. Still no heavy lifting >10-12 lbs and pelvic rest advised until cleared at 6wk postpartum visit.  -If stooling regularly x 1-2 weeks, can take stool softener every other day x 1 week, taper as tolerated.  No follow-ups on file.   Estil Mangle, DO [facility]

## 2024-10-15 ENCOUNTER — Encounter: Admitting: Obstetrics

## 2024-10-22 ENCOUNTER — Encounter: Payer: Self-pay | Admitting: Obstetrics

## 2024-11-17 NOTE — Progress Notes (Unsigned)
   OBSTETRICS POSTPARTUM CLINIC PROGRESS NOTE  Subjective:     Cynthia Davies is a 38 y.o. [phone] female who presents for a postpartum visit. She is 6 weeks postpartum following a repeat low cervical transverse Cesarean section. I have reviewed the prenatal and intrapartum course. The delivery was at 40 gestational weeks.  Anesthesia: spinal. Postpartum course has been uncomplicated. Baby's course has been uncomplicated. Baby is feeding by {breast/bottle:69}. Bleeding: patient {HAS HAS WNU:81165} not resumed menses, with No LMP recorded.. Bowel function is {normal:32111}. Bladder function is {normal:32111}. Patient {is/is not:9024} sexually active. Contraception method desired is {contraceptive method:5051}. Postpartum depression screening: {neg default:13464::negative}.  EDPS score is ***.    The following portions of the patient's history were reviewed and updated as appropriate: allergies, current medications, past family history, past medical history, past social history, past surgical history, and problem list.  Review of Systems {ros; complete:30496}   Objective:    There were no vitals taken for this visit.  General:  alert and no distress   Breasts:  inspection negative, no nipple discharge or bleeding, no masses or nodularity palpable  Lungs: clear to auscultation bilaterally  Heart:  regular rate and rhythm, S1, S2 normal, no murmur, click, rub or gallop  Abdomen: soft, non-tender; bowel sounds normal; no masses,  no organomegaly.  ***Well healed Pfannenstiel incision   Vulva:  normal  Vagina: normal vagina, no discharge, exudate, lesion, or erythema  Cervix:  no cervical motion tenderness and no lesions  Corpus: normal size, contour, position, consistency, mobility, non-tender  Adnexa:  normal adnexa and no mass, fullness, tenderness  Rectal Exam: Not performed.         Labs:  Lab Results  Component Value Date   HGB 8.8 (L) 10/09/2024     Assessment:   No  diagnosis found.   Plan:    1. Contraception: {method:5051} 2. Will check Hgb for h/o postpartum anemia of less than 10.  3. Follow up in: {1-10:13787} {time; units:19136} or as needed.    Vicci Rollo BRAVO, RN [facility]

## 2024-11-19 ENCOUNTER — Ambulatory Visit: Admitting: Obstetrics

## 2024-11-19 ENCOUNTER — Other Ambulatory Visit (HOSPITAL_COMMUNITY)
Admission: RE | Admit: 2024-11-19 | Discharge: 2024-11-19 | Disposition: A | Discharge status: Home / Self Care | Source: Ambulatory Visit | Attending: Obstetrics | Admitting: Obstetrics

## 2024-11-19 ENCOUNTER — Encounter: Payer: Self-pay | Admitting: Obstetrics

## 2024-11-19 DIAGNOSIS — Z1331 Encounter for screening for depression: Secondary | ICD-10-CM | POA: Diagnosis not present

## 2024-11-19 DIAGNOSIS — N898 Other specified noninflammatory disorders of vagina: Secondary | ICD-10-CM | POA: Diagnosis present

## 2024-11-19 DIAGNOSIS — O99019 Anemia complicating pregnancy, unspecified trimester: Secondary | ICD-10-CM

## 2024-11-19 DIAGNOSIS — R030 Elevated blood-pressure reading, without diagnosis of hypertension: Secondary | ICD-10-CM | POA: Diagnosis not present

## 2024-11-20 LAB — COMPREHENSIVE METABOLIC PANEL WITH GFR
ALT: 14 IU/L (ref 0–32)
AST: 14 IU/L (ref 0–40)
Albumin: 4.4 g/dL (ref 3.9–4.9)
Alkaline Phosphatase: 66 IU/L (ref 41–116)
BUN/Creatinine Ratio: 6 — ABNORMAL LOW (ref 9–23)
BUN: 6 mg/dL (ref 6–20)
Bilirubin Total: 0.5 mg/dL (ref 0.0–1.2)
CO2: 22 mmol/L (ref 20–29)
Calcium: 9.3 mg/dL (ref 8.7–10.2)
Chloride: 105 mmol/L (ref 96–106)
Creatinine, Ser: 0.99 mg/dL (ref 0.57–1.00)
Globulin, Total: 2.5 g/dL (ref 1.5–4.5)
Glucose: 74 mg/dL (ref 70–99)
Potassium: 4.3 mmol/L (ref 3.5–5.2)
Sodium: 142 mmol/L (ref 134–144)
Total Protein: 6.9 g/dL (ref 6.0–8.5)
eGFR: 75 mL/min/1.73 (ref >59)

## 2024-11-20 LAB — PROTEIN / CREATININE RATIO, URINE
Creatinine, Urine: 213 mg/dL
Protein, Ur: 28.6 mg/dL
Protein/Creat Ratio: 134 mg/g{creat} (ref 0–200)

## 2024-11-20 LAB — CBC
Hematocrit: 40.5 % (ref 34.0–46.6)
Hemoglobin: 12.9 g/dL (ref 11.1–15.9)
MCH: 29.2 pg (ref 26.6–33.0)
MCHC: 31.9 g/dL (ref 31.5–35.7)
MCV: 92 fL (ref 79–97)
Platelets: 320 x10E3/uL (ref 150–450)
RBC: 4.42 x10E6/uL (ref 3.77–5.28)
RDW: 13 % (ref 11.7–15.4)
WBC: 8 x10E3/uL (ref 3.4–10.8)

## 2024-11-20 LAB — CERVICOVAGINAL ANCILLARY ONLY
Chlamydia: POSITIVE — AB
Comment: NEGATIVE
Comment: NORMAL
Neisseria Gonorrhea: NEGATIVE

## 2024-11-24 ENCOUNTER — Ambulatory Visit: Payer: Self-pay | Admitting: Obstetrics

## 2024-11-24 ENCOUNTER — Other Ambulatory Visit: Payer: Self-pay

## 2024-11-24 DIAGNOSIS — A749 Chlamydial infection, unspecified: Secondary | ICD-10-CM

## 2024-11-24 MED ORDER — DOXYCYCLINE HYCLATE 100 MG PO CAPS: 100.0000 mg | ORAL_CAPSULE | Freq: Two times a day (BID) | ORAL | 0 refills | Status: AC

## 2024-11-24 NOTE — Progress Notes (Signed)
 Rx for doxycycline  to treat +Chlamydia.  Spoke to pt re: results.  She will make sure partner gets treated and will abstain from intercourse for 7 days after both have completed treatment.

## 2024-11-24 NOTE — Progress Notes (Signed)
 Spoke to pt re: lab results.  She continues to have daily headaches.  Counseled on Tylenol  use and drinking plenty of fluids.  Relayed +chlamydia results.  Pt will abstain from sex for 7 days after both she and partner have completed treatment.

## 2024-12-24 ENCOUNTER — Encounter: Payer: Self-pay | Admitting: Obstetrics

## 2024-12-31 ENCOUNTER — Ambulatory Visit

## 2025-01-01 ENCOUNTER — Ambulatory Visit

## 2025-01-02 ENCOUNTER — Ambulatory Visit

## 2025-01-07 ENCOUNTER — Ambulatory Visit

## 2025-01-07 NOTE — Progress Notes (Unsigned)
" ° ° °  NURSE VISIT NOTE  Subjective:    Patient ID: Cynthia Davies, female    DOB: 16-Jul-1986, 39 y.o.   MRN: [id number]  HPI  Patient is a 39 y.o. [id number] female who presents for depo provera  injection.   Objective:    There were no vitals taken for this visit.  Last Annual: 02/01/2023. Last pap: 03/31/2024. Last Depo-Provera : 10/08/2024. Side Effects if any: none. Serum HCG indicated? No . Depo-Provera  150 mg IM given by: Camelia Fetters, CMA. Site: {[facility] INJ [id number]  Lab Review  No results found for any visits on 01/07/25.  Assessment:   1. Surveillance for Depo-Provera  contraception      Plan:   Next appointment due between April 15 and April 29.    Camelia Fetters, CMA [facility]

## 2025-01-07 NOTE — Patient Instructions (Incomplete)
